# Patient Record
Sex: Female | Born: 1990
Health system: Southern US, Community
[De-identification: ages and names within clinical notes are randomized; demographics above are authoritative.]

## PROBLEM LIST (undated history)

## (undated) ENCOUNTER — Ambulatory Visit (HOSPITAL_COMMUNITY): Discharge status: Absent without leave | Payer: Self-pay

## (undated) DIAGNOSIS — J45909 Unspecified asthma, uncomplicated: Secondary | ICD-10-CM

## (undated) DIAGNOSIS — J309 Allergic rhinitis, unspecified: Secondary | ICD-10-CM

## (undated) HISTORY — DX: Allergic rhinitis, unspecified: J30.9

---
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
## 2015-08-14 ENCOUNTER — Encounter (HOSPITAL_COMMUNITY): Payer: Self-pay | Admitting: *Deleted

## 2015-08-14 ENCOUNTER — Emergency Department (HOSPITAL_COMMUNITY)
Admission: EM | Admit: 2015-08-14 | Discharge: 2015-08-14 | Disposition: A | Discharge status: Home / Self Care | Payer: Self-pay | Attending: Emergency Medicine | Admitting: Emergency Medicine

## 2015-08-14 DIAGNOSIS — Z3202 Encounter for pregnancy test, result negative: Secondary | ICD-10-CM | POA: Insufficient documentation

## 2015-08-14 DIAGNOSIS — N12 Tubulo-interstitial nephritis, not specified as acute or chronic: Secondary | ICD-10-CM | POA: Insufficient documentation

## 2015-08-14 LAB — URINALYSIS, ROUTINE W REFLEX MICROSCOPIC
Bilirubin Urine: NEGATIVE
Glucose, UA: NEGATIVE mg/dL
Ketones, ur: 15 mg/dL — AB
Nitrite: POSITIVE — AB
PH: 5.5 (ref 5.0–8.0)
Protein, ur: 30 mg/dL — AB
Specific Gravity, Urine: 1.018 (ref 1.005–1.030)

## 2015-08-14 LAB — BASIC METABOLIC PANEL
Anion gap: 14 (ref 5–15)
BUN: 10 mg/dL (ref 6–20)
CO2: 21 mmol/L — ABNORMAL LOW (ref 22–32)
Calcium: 10.1 mg/dL (ref 8.9–10.3)
Chloride: 102 mmol/L (ref 101–111)
Creatinine, Ser: 0.78 mg/dL (ref 0.44–1.00)
GFR calc Af Amer: >60 mL/min (ref >60)
GFR calc non Af Amer: >60 mL/min (ref >60)
Glucose, Bld: 98 mg/dL (ref 65–99)
Potassium: 4.1 mmol/L (ref 3.5–5.1)
Sodium: 137 mmol/L (ref 135–145)

## 2015-08-14 LAB — CBC
HCT: 44.6 % (ref 36.0–46.0)
Hemoglobin: 15.3 g/dL — ABNORMAL HIGH (ref 12.0–15.0)
MCH: 31.9 pg (ref 26.0–34.0)
MCHC: 34.3 g/dL (ref 30.0–36.0)
MCV: 92.9 fL (ref 78.0–100.0)
Platelets: 265 10*3/uL (ref 150–400)
RBC: 4.8 MIL/uL (ref 3.87–5.11)
RDW: 12.2 % (ref 11.5–15.5)
WBC: 11.6 10*3/uL — ABNORMAL HIGH (ref 4.0–10.5)

## 2015-08-14 LAB — URINE MICROSCOPIC-ADD ON

## 2015-08-14 LAB — I-STAT BETA HCG BLOOD, ED (MC, WL, AP ONLY)

## 2015-08-14 MED ORDER — IBUPROFEN 800 MG PO TABS
800.0000 mg | ORAL_TABLET | Freq: Once | ORAL | Status: AC
  Administered 2015-08-14: 800 mg via ORAL
  Filled 2015-08-14: qty 1

## 2015-08-14 MED ORDER — AMOXICILLIN-POT CLAVULANATE 875-125 MG PO TABS: 1.0000 | ORAL_TABLET | Freq: Two times a day (BID) | ORAL | Status: DC

## 2015-08-14 MED ORDER — AMOXICILLIN-POT CLAVULANATE 875-125 MG PO TABS
1.0000 | ORAL_TABLET | Freq: Once | ORAL | Status: AC
  Administered 2015-08-14: 1 via ORAL
  Filled 2015-08-14: qty 1

## 2015-08-14 MED ORDER — IBUPROFEN 800 MG PO TABS: 800.0000 mg | ORAL_TABLET | Freq: Three times a day (TID) | ORAL | Status: DC

## 2015-08-14 NOTE — Discharge Instructions (Signed)
Pyelonephritis, Adult °Pyelonephritis is a kidney infection. The kidneys are the organs that filter a person's blood and move waste out of the bloodstream and into the urine. Urine passes from the kidneys, through the ureters, and into the bladder. There are two main types of pyelonephritis: °· Infections that come on quickly without any warning (acute pyelonephritis). °· Infections that last for a long period of time (chronic pyelonephritis). °In most cases, the infection clears up with treatment and does not cause further problems. More severe infections or chronic infections can sometimes spread to the bloodstream or lead to other problems with the kidneys. °CAUSES °This condition is usually caused by: °· Bacteria traveling from the bladder to the kidney through infected urine. The urine in the bladder can become infected with bacteria from: °¨ Bladder infection (cystitis). °¨ Inflammation of the prostate gland (prostatitis). °¨ Sexual intercourse, in females. °· Bacteria traveling from the bloodstream to the kidney. °RISK FACTORS °This condition is more likely to develop in: °· Pregnant women. °· Older people. °· People who have diabetes. °· People who have kidney stones or bladder stones. °· People who have other abnormalities of the kidney or ureter. °· People who have a catheter placed in the bladder. °· People who have cancer. °· People who are sexually active. °· Women who use spermicides. °· People who have had a prior urinary tract infection. °SYMPTOMS °Symptoms of this condition include: °· Frequent urination. °· Strong or persistent urge to urinate. °· Burning or stinging when urinating. °· Abdominal pain. °· Back pain. °· Pain in the side or flank area. °· Fever. °· Chills. °· Blood in the urine, or dark urine. °· Nausea. °· Vomiting. °DIAGNOSIS °This condition may be diagnosed based on: °· Medical history and physical exam. °· Urine tests. °· Blood tests. °You may also have imaging tests of the  kidneys, such as an ultrasound or CT scan. °TREATMENT °Treatment for this condition may depend on the severity of the infection. °· If the infection is mild and is found early, you may be treated with antibiotic medicines taken by mouth. You will need to drink fluids to remain hydrated. °· If the infection is more severe, you may need to stay in the hospital and receive antibiotics given directly into a vein through an IV tube. You may also need to receive fluids through an IV tube if you are not able to remain hydrated. After your hospital stay, you may need to take oral antibiotics for a period of time. °Other treatments may be required, depending on the cause of the infection. °HOME CARE INSTRUCTIONS °Medicines °· Take over-the-counter and prescription medicines only as told by your health care provider. °· If you were prescribed an antibiotic medicine, take it as told by your health care provider. Do not stop taking the antibiotic even if you start to feel better. °General Instructions °· Drink enough fluid to keep your urine clear or pale yellow. °· Avoid caffeine, tea, and carbonated beverages. They tend to irritate the bladder. °· Urinate often. Avoid holding in urine for long periods of time. °· Urinate before and after sex. °· After a bowel movement, women should cleanse from front to back. Use each tissue only once. °· Keep all follow-up visits as told by your health care provider. This is important. °SEEK MEDICAL CARE IF: °· Your symptoms do not get better after 2 days of treatment. °· Your symptoms get worse. °· You have a fever. °SEEK IMMEDIATE MEDICAL CARE IF: °· You   are unable to take your antibiotics or fluids.  You have shaking chills.  You vomit.  You have severe flank or back pain.  You have extreme weakness or fainting.   This information is not intended to replace advice given to you by your health care provider. Make sure you discuss any questions you have with your health care  provider.   Document Released: 07/13/2005 Document Revised: 04/03/2015 Document Reviewed: 11/05/2014 Elsevier Interactive Patient Education 2016 [employer].  Emergency Department Resource Guide 1) Find a Doctor and Pay Out of Pocket Although you won't have to find out who is covered by your insurance plan, it is a good idea to ask around and get recommendations. You will then need to call the office and see if the doctor you have chosen will accept you as a new patient and what types of options they offer for patients who are self-pay. Some doctors offer discounts or will set up payment plans for their patients who do not have insurance, but you will need to ask so you aren't surprised when you get to your appointment.  2) Contact Your Local Health Department Not all health departments have doctors that can see patients for sick visits, but many do, so it is worth a call to see if yours does. If you don't know where your local health department is, you can check in your phone book. The CDC also has a tool to help you locate your state's health department, and many state websites also have listings of all of their local health departments.  3) Find a Walk-in Clinic If your illness is not likely to be very severe or complicated, you may want to try a walk in clinic. These are popping up all over the country in pharmacies, drugstores, and shopping centers. They're usually staffed by nurse practitioners or physician assistants that have been trained to treat common illnesses and complaints. They're usually fairly quick and inexpensive. However, if you have serious medical issues or chronic medical problems, these are probably not your best option.  No Primary Care Doctor: - Call Health Connect at  [phone] - they can help you locate a primary care doctor that  accepts your insurance, provides certain services, etc. - Physician Referral Service- [phone]  Chronic Pain  Problems: Organization         Address  Phone   Notes  [facility]  [phone] Patients need to be referred by their primary care doctor.   Medication Assistance: Organization         Address  Phone   Notes  [facility] Medication [facility] [address] [place] [place], [place] [address] [phone] --Must be a resident of [facility] -- Must have NO insurance coverage whatsoever (no Medicaid/ Medicare, etc.) -- The pt. MUST have a primary care doctor that directs their care regularly and follows them in the community   [facility]  [phone]   [employer]  [phone]    Agencies that provide inexpensive medical care: Organization         Address  Phone   Notes  [facility]  [phone]   [facility]    [phone]   [facility] [address] [place], [place] [address] [phone]   [facility] [address], [place] [phone]   [facility]    [phone]   [facility]    (  [phone]) [phone]   °[facility] ° [address], [place] Phone:  ([phone]) [phone], Fax:  [phone] Hours of Operation:  9 am - 6 pm, M-F.  Also accepts Medicaid/Medicare and self-pay.  °[facility] ° [address], [place] Phone: ([phone]) [phone], Fax: [phone]. Hours of Operation:  8:30 am - 5:30 pm, M-F.  Also accepts Medicaid and self-pay.  °[facility] [place] [address], [place] Phone: ([phone]) [phone]   °[facility] [address], [place], [place] (336)[phone], Ext. 123 Mondays & Thursdays: 7-9 AM.  First 15 patients are seen on a first come, first serve basis. °  ° °Medicaid-accepting [place] Providers: ° °Organization         Address  Phone   Notes  °[facility] [address], [place] ([phone]) [phone] Also  accepts self-pay patients.  °[facility] [address], [place] ° ([phone]) [phone]   °[facility] [address], [place] ([phone]) [phone]   °[facility] [address] [place] [address], [place] ([phone]) [phone]   °Veita Bland [address], [place]  ° ([phone]) [phone] Only accepts [place] Access Medicaid patients after they have their name applied to their card.  ° °Self-Pay (no insurance) in [place]: ° °Organization         Address  Phone   Notes  °Sickle Cell Patients, [facility] [address], [place] ([phone]) [phone]   °[facility] [address], [place] (336) [phone]   °[facility] ° [address], [place] ([phone]) [phone]   °[facility]/Dr. Osei-Bonsu ° [address] [place] [address], [place] or [address], [place] ([phone]) [phone] Phone number for both [place] and [place] locations is the same.  °[facility] [address], [place] ([phone]) [phone]   °[facility] [address] [place] [address], [place] or [address] (336) 852-7530 °(336) [phone]   °[facility] [address],  (336) 350-1642, phone; ([phone]) [phone], fax Sees patients 1st and 3rd Saturday of every month.  Must not qualify for public or private insurance (i.e. Medicaid, Medicare, [place] Health Choice, Veterans' Benefits) • Household income should be no more than 200% of the poverty level •The clinic cannot treat you if you are pregnant or think you are pregnant • Sexually transmitted diseases are not treated at the clinic.  ° ° °Dental Care: °Organization         Address  Phone  Notes  °[place] [facility] [address],  ([phone]) [phone] Accepts children up to age 21 who are enrolled in Medicaid or [place] Health Choice; pregnant  women with a Medicaid card; and children who have applied for Medicaid or [place] Health Choice, but were declined, whose parents can pay a reduced fee at time of service.  °[place] [facility] [place]  [address], [place] (336) [phone] Accepts children up to age 21 who are enrolled in Medicaid or [place] Health Choice; pregnant women with a Medicaid card; and children who have applied for Medicaid or  Health Choice, but were declined, whose parents can pay a reduced fee at time of service.  °[facility] ° [address], [place] (336) [phone] Patients are seen by appointment only. Walk-ins are not accepted. [facility] will see patients 18 years of age and older. °Monday - Tuesday (8am-5pm) °Most Wednesdays (8:30-5pm) °$30 per visit, cash only  °[facility] ° [address], [place] ([phone]) [phone] Patients are seen by appointment only. Walk-ins are not accepted. [facility] will see patients 18 years of age and older. °One Wednesday Evening (Monthly: Volunteer Based).  $30 per visit, cash only  °[facility]  ([phone]) [phone] for adults; Children under age 4, call Graduate Pediatric Dentistry at [phone]. Children aged 4-14, please call [phone]) [phone] to request a pediatric application. ° Dental services are provided in all areas of dental care including fillings, crowns and bridges, complete and partial dentures, implants, gum treatment, root canals, and extractions. Preventive care is also provided. Treatment is provided to both adults and children. °Patients are selected via a lottery and there is often a waiting list. °  °[facility] [address], °[place] ° (336) 763-8833 [email or web address] °  °[facility] [address], [place], [place] (336)723-1848, Ext. 123 Second and Fourth Thursday of each month, opens at 6:30 AM; Clinic ends at 9 AM.  Patients are  seen on a first-come first-served basis, and a limited number are seen during each clinic.  ° °[facility] ° [address], [place], [place] ([phone]) [phone]   Eligibility Requirements °You must have lived in [place], [place], or [place] counties for at least the last three months. °  You cannot be eligible for state or federal sponsored healthcare insurance, including Veterans Administration, Medicaid, or Medicare. °  You generally cannot be eligible for healthcare insurance through your employer.  °  How to apply: °Eligibility screenings are held every Tuesday and Wednesday afternoon from 1:00 pm until 4:00 pm. You do not need an appointment for the interview!  °[facility] [address], [place], [place] [phone]   °[facility]  336-342-8273   °[place]  336-703-3100   °[facility] [place] [facility]  336-570-6415   ° °Behavioral Health Resources in the Community: °Intensive Outpatient Programs °Organization         Address  Phone  Notes  °[facility] [address], [place], [place] 336-878-6098   °[facility] [address], [place], [place] 336-832-9800   °[facility] [address], [place], [place] ° 336-882-2125   °[facility] [address],  °[place], [place] 1-800-853-5163 or 336-641-4981   °Substance Abuse Resources °Organization         Address  Phone  Notes  °[facility]  336-882-2125   °[facility]  336-784-9470   °[facility]  336-285-9073   °[facility]  336-845-3988   °Residential & Outpatient Substance Abuse Program  1-800-659-3381   °Psychological Services °Organization         Address  Phone  Notes  ° Health  336- 832-9600   °[facility]  336- 378-7881   °[facility] [address], [place] 1-800-853-5163 or 336-641-4981   ° °Mobile Crisis  Teams °Organization         Address  Phone  Notes  °[facility], Mobile Crisis Care Unit  1-877-626-1772   °Assertive °[facility] ° [address] ,   [place] 161-096-0454   [facility] [address] [place] [phone]    Self-Help/Support Groups Organization         Address  Phone             Notes  [facility] - variety of support groups  336- I7437963 Call for more information  Narcotics Anonymous (NA), [facility] [address], [employer] [place]  2 meetings at this location   Statistician         Address  Phone  Notes  [facility] [address],    [place]  2-956-213-0865   [facility]  [address], [place] [address], [place], [place] 295-284-1324   [facility] [address] [place], [place] [phone] Admissions: 8am-3pm M-F  [facility] [address],    [place], [place] [phone]   [facility] [address] [place], [place], [place] [phone]   [facility] [address]  [place], [place] [phone]   [facility] Intensive Outpatient [address], [place], [place] [phone]   [facility] ([facility] [address] [place].,  [place], [place] 9-323-557-3220 or 517-735-0256   [facility] [address] [place], [place] 628-315-1761 Accepts Medicaid  [facility] [address],  [place] 6-073-710-6269 Substance Abuse/Addiction Treatment   [facility] Organization         Address  Phone  Notes  [facility]  [phone]   Angie Fava, PhD [address] [place], [place]   602-772-4204 or 828-196-0924   [facility]   [address] [place], [place] [phone]   [facility] [address] [place], [place] [phone])[phone]  Insurance/Medicaid/sponsorship through [facility] [address]                                    [place], [place] [phone] Therapy/tele-psych/case  [facility] [address], [place] [phone]    Dr. Lolly Mustache  [phone]   [facility]  [employer] [facility] Dept. 1) [address] [place] 2) [address] [place] 3)  [address], [place] [phone]  [phone]   [facility] Child Abuse Hotline 4047974068 or 949-582-9675 (After Hours)

## 2015-08-14 NOTE — ED Provider Notes (Signed)
CSN: [id number]     Arrival date & time 08/14/15  1833 History   First MD Initiated Contact with Patient 08/14/15 2111     Chief Complaint  Patient presents with  . Urinary Tract Infection     (Consider location/radiation/quality/duration/timing/severity/associated sxs/prior Treatment) HPI Patient developed flank pain 5 days ago. She reports it's on the right. She describes it as gradual in onset. It's a general ache. This morning she developed a fever and was seen at urgent care. She had a positive urine dip and was referred to the emergency department for further evaluation. Patient reports that she has had no vomiting or diarrhea. No nausea. No vaginal bleeding or discharge. She reports that the pain is actually somewhat improved by standing and walking. It is not exacerbated by movements. History reviewed. No pertinent past medical history. History reviewed. No pertinent past surgical history. No family history on file. Social History  Substance Use Topics  . Smoking status: Never Smoker   . Smokeless tobacco: None  . Alcohol Use: No   OB History    No data available     Review of Systems  10 Systems reviewed and are negative for acute change except as noted in the HPI. Marland Kitchen  Allergies  Review of patient's allergies indicates no known allergies.  Home Medications   Prior to Admission medications   Medication Sig Start Date End Date Taking? Authorizing Provider  aspirin-acetaminophen-caffeine (EXCEDRIN MIGRAINE) [phone] MG tablet Take 2 tablets by mouth every 6 (six) hours as needed for headache.   Yes Historical Provider, MD  amoxicillin-clavulanate (AUGMENTIN) 875-125 MG tablet Take 1 tablet by mouth 2 (two) times daily. One po bid x 7 days 08/14/15   Arby Barrette, MD  ibuprofen (ADVIL,MOTRIN) 800 MG tablet Take 1 tablet (800 mg total) by mouth 3 (three) times daily. 08/14/15   Arby Barrette, MD   BP 125/74 mmHg  Pulse 112  Temp(Src) 100.5 F (38.1 C) (Oral)  Resp  16  SpO2 98% Physical Exam  Constitutional: She is oriented to person, place, and time. She appears well-developed and well-nourished.  HENT:  Head: Normocephalic and atraumatic.  Eyes: EOM are normal. Pupils are equal, round, and reactive to light.  Neck: Neck supple.  Cardiovascular: Normal rate, regular rhythm, normal heart sounds and intact distal pulses.   Pulmonary/Chest: Effort normal and breath sounds normal.  Abdominal: Soft. Bowel sounds are normal. She exhibits no distension. There is no tenderness.  No flank pain to percussion. Patient can sit up and stand without exacerbating pain.  Musculoskeletal: Normal range of motion. She exhibits no edema.  Neurological: She is alert and oriented to person, place, and time. She has normal strength. Coordination normal. GCS eye subscore is 4. GCS verbal subscore is 5. GCS motor subscore is 6.  Skin: Skin is warm, dry and intact.  Psychiatric: She has a normal mood and affect.    ED Course  Procedures (including critical care time) Labs Review Labs Reviewed  CBC - Abnormal; Notable for the following:    WBC 11.6 (*)    Hemoglobin 15.3 (*)    All other components within normal limits  BASIC METABOLIC PANEL - Abnormal; Notable for the following:    CO2 21 (*)    All other components within normal limits  URINALYSIS, ROUTINE W REFLEX MICROSCOPIC (NOT AT [facility]) - Abnormal; Notable for the following:    APPearance CLOUDY (*)    Hgb urine dipstick LARGE (*)    Ketones, ur 15 (*)  Protein, ur 30 (*)    Nitrite POSITIVE (*)    Leukocytes, UA SMALL (*)    All other components within normal limits  URINE MICROSCOPIC-ADD ON - Abnormal; Notable for the following:    Squamous Epithelial / LPF 6-30 (*)    Bacteria, UA MANY (*)    All other components within normal limits  URINE CULTURE  I-STAT BETA HCG BLOOD, ED ([facility], [facility], [facility] ONLY)    Imaging Review No results found. I have personally reviewed and evaluated these images and lab  results as part of my medical decision-making.   EKG Interpretation None      MDM   Final diagnoses:  Pyelonephritis   Patient developed gradual onset of flank pain. She is subsequently developed fever. Urinalysis is positive. At this time the patient is well in appearance. She is otherwise healthy. She has no vomiting and no reproducible intra-abdominal pain. She does have fever and mild tachycardia but no signs of volume depletion and no hypotension. At this time I feel she is safe to initiate outpatient antibiotic therapy for pyelonephritis. She is counseled on signs and symptoms for which to return.  Arby Barrette, MD 08/14/15 2328

## 2015-08-14 NOTE — ED Notes (Addendum)
Pt sent from [facility] clinic for evaluation for kidney infection. Pt had urinalysis performed at clinic, which showed infection. Pt complains of 4/10 right flank pain since Friday and had fever since this morning.

## 2015-08-17 LAB — URINE CULTURE

## 2015-08-18 ENCOUNTER — Telehealth (HOSPITAL_BASED_OUTPATIENT_CLINIC_OR_DEPARTMENT_OTHER): Payer: Self-pay | Admitting: Emergency Medicine

## 2015-08-18 NOTE — Telephone Encounter (Signed)
Post ED Visit - Positive Culture Follow-up  Culture report reviewed by antimicrobial stewardship pharmacist:   Enzo Bi, Pharm.D.  Celedonio Miyamoto, Pharm.D., BCPS  Garvin Fila, Pharm.D.  Georgina Pillion, Pharm.D., BCPS  [place], [address].D., BCPS, AAHIVP  Estella Husk, Pharm.D., BCPS, AAHIVP  Tennis Must, Pharm.D.  Sherle Poe, [address].D.  Positive urine culture E. coli Treated with augmentin, organism sensitive to the same and no further patient follow-up is required at this time.  Berle Mull 08/18/2015, 11:26 AM

## 2017-05-09 ENCOUNTER — Ambulatory Visit (HOSPITAL_COMMUNITY)
Admission: EM | Admit: 2017-05-09 | Discharge: 2017-05-09 | Disposition: A | Discharge status: Home / Self Care | Payer: Self-pay | Attending: Internal Medicine | Admitting: Internal Medicine

## 2017-05-09 ENCOUNTER — Encounter (HOSPITAL_COMMUNITY): Payer: Self-pay | Admitting: Emergency Medicine

## 2017-05-09 DIAGNOSIS — B350 Tinea barbae and tinea capitis: Secondary | ICD-10-CM

## 2017-05-09 DIAGNOSIS — J4521 Mild intermittent asthma with (acute) exacerbation: Secondary | ICD-10-CM

## 2017-05-09 HISTORY — DX: Unspecified asthma, uncomplicated: J45.909

## 2017-05-09 MED ORDER — IPRATROPIUM-ALBUTEROL 0.5-2.5 (3) MG/3ML IN SOLN
3.0000 mL | Freq: Once | RESPIRATORY_TRACT | Status: AC
  Administered 2017-05-09: 3 mL via RESPIRATORY_TRACT

## 2017-05-09 MED ORDER — FLUCONAZOLE 200 MG PO TABS: 200.0000 mg | ORAL_TABLET | ORAL | 0 refills | Status: AC

## 2017-05-09 MED ORDER — ALBUTEROL SULFATE HFA 108 (90 BASE) MCG/ACT IN AERS: 1.0000 | INHALATION_SPRAY | RESPIRATORY_TRACT | 0 refills | Status: DC | PRN

## 2017-05-09 MED ORDER — IPRATROPIUM-ALBUTEROL 0.5-2.5 (3) MG/3ML IN SOLN
RESPIRATORY_TRACT | Status: AC
  Filled 2017-05-09: qty 3

## 2017-05-09 MED ORDER — PREDNISONE 50 MG PO TABS
50.0000 mg | ORAL_TABLET | Freq: Every day | ORAL | 0 refills | Status: AC
Start: 2017-05-09 — End: 2017-05-14

## 2017-05-09 NOTE — ED Triage Notes (Signed)
Pt sts increased asthma sx over last week with cough

## 2017-05-09 NOTE — ED Provider Notes (Signed)
[facility]    CSN: [id number] Arrival date & time: 05/09/17  1237     History   Chief Complaint Chief Complaint  Patient presents with  . Asthma    HPI Linda Carrillo is a 26 y.o. female. She presents today with one-month history of increasing cough, breathlessness. She has a history of asthma managed with as needed albuterol. In the last week, she is waking frequently at night with breathlessness. Using her inhaler much more than usual.  Not feeling sick, no runny/congested nose, no sore throat, not achy. Also in the last couple days has an itchy/dry rash at her hairline, particularly the left temple. Her sister has a similar rash.  No family hx psoriasis   HPI  Past Medical History:  Diagnosis Date  . Asthma     History reviewed. No pertinent surgical history.    Home Medications    Prior to Admission medications   Medication Sig Start Date End Date Taking? Authorizing Provider  albuterol (PROVENTIL HFA;VENTOLIN HFA) 108 (90 Base) MCG/ACT inhaler Inhale 1-2 puffs into the lungs every 4 (four) hours as needed for wheezing or shortness of breath. 05/09/17   Eustace Moore, MD  amoxicillin-clavulanate (AUGMENTIN) 875-125 MG tablet Take 1 tablet by mouth 2 (two) times daily. One po bid x 7 days 08/14/15   Arby Barrette, MD  aspirin-acetaminophen-caffeine [facility] MIGRAINE) [phone] MG tablet Take 2 tablets by mouth every 6 (six) hours as needed for headache.    [provider]  fluconazole (DIFLUCAN) 200 MG tablet Take 1 tablet (200 mg total) by mouth once a week. 05/09/17 06/14/17  Eustace Moore, MD  ibuprofen (ADVIL,MOTRIN) 800 MG tablet Take 1 tablet (800 mg total) by mouth 3 (three) times daily. 08/14/15   Arby Barrette, MD  predniSONE (DELTASONE) 50 MG tablet Take 1 tablet (50 mg total) by mouth daily. 05/09/17 05/14/17  Eustace Moore, MD    Family History History reviewed. No pertinent family history.  Social History Social History    Substance Use Topics  . Smoking status: Never Smoker  . Smokeless tobacco: Never Used  . Alcohol use No     Allergies   Patient has no known allergies.   Review of Systems Review of Systems  All other systems reviewed and are negative.    Physical Exam Triage Vital Signs ED Triage Vitals [05/09/17 1256]  Enc Vitals Group     BP 132/86     Pulse Rate (!) 102     Resp 18     Temp 98.2 F (36.8 C)     Temp Source Oral     SpO2 97 %     Weight      Height      Pain Score      Pain Loc    Updated Vital Signs BP 132/86 (BP Location: Right Arm)   Pulse (!) 102   Temp 98.2 F (36.8 C) (Oral)   Resp 18   SpO2 97%   Physical Exam  Constitutional: She is oriented to person, place, and time. No distress.  HENT:  Head: Atraumatic.  B TMs translucent, no erythema Minimal nasal congestion bilat Throat pink, scant clear post nasal drainage evident  Eyes:  Conjugate gaze observed, no eye redness/discharge  Neck: Neck supple.  Cardiovascular: Normal rate and regular rhythm.   Pulmonary/Chest: No respiratory distress. She has no rales.  Coarse but symmetric breath sounds throughout, diminished posteriorly with decreased air movement and scattered wheezes  Abdominal:  She exhibits no distension.  Musculoskeletal: Normal range of motion.  Neurological: She is alert and oriented to person, place, and time.  Skin: Skin is warm and dry.  No cyanosis  Nursing note and vitals reviewed.    UC Treatments / Results   Procedures Procedures (including critical care time)  Medications Ordered in UC Medications  ipratropium-albuterol (DUONEB) 0.5-2.5 (3) MG/3ML nebulizer solution 3 mL (3 mLs Nebulization Given 05/09/17 1325)      Final Clinical Impressions(s) / UC Diagnoses   Final diagnoses:  Mild intermittent asthma with exacerbation  Tinea capitis   Anticipate gradual improvement in cough and breathlessness. DuoNeb breathing treatment given at the urgent care  today. Prescriptions for an albuterol inhaler and prednisone were sent to the pharmacy. Rash is consistent with ringworm (tinea).  Avoid sharing hair care items.  Prescription for fluconazole was sent to the pharmacy.  Please establish care with a primary care provider to guide further treatment plans for both these issues.   New Prescriptions New Prescriptions   ALBUTEROL (PROVENTIL HFA;VENTOLIN HFA) 108 (90 BASE) MCG/ACT INHALER    Inhale 1-2 puffs into the lungs every 4 (four) hours as needed for wheezing or shortness of breath.   FLUCONAZOLE (DIFLUCAN) 200 MG TABLET    Take 1 tablet (200 mg total) by mouth once a week.   PREDNISONE (DELTASONE) 50 MG TABLET    Take 1 tablet (50 mg total) by mouth daily.     Controlled Substance Prescriptions [place] Controlled Substance Registry consulted? No   Eustace Moore, MD 05/11/17 1240

## 2017-05-09 NOTE — ED Notes (Signed)
Pt also sts ring worm type rash to forehead

## 2017-05-09 NOTE — Discharge Instructions (Addendum)
Anticipate gradual improvement in cough and breathlessness. DuoNeb breathing treatment given at the urgent care today. Prescriptions for an albuterol inhaler and prednisone were sent to the pharmacy. Rash is consistent with ringworm (tinea).  Avoid sharing hair care items.  Prescription for fluconazole was sent to the pharmacy.  Please establish care with a primary care provider to guide further treatment plans for both these issues.  ```````````````````````

## 2017-11-07 ENCOUNTER — Ambulatory Visit (HOSPITAL_COMMUNITY)
Admission: EM | Admit: 2017-11-07 | Discharge: 2017-11-07 | Disposition: A | Discharge status: Home / Self Care | Payer: Self-pay | Attending: Internal Medicine | Admitting: Internal Medicine

## 2017-11-07 ENCOUNTER — Other Ambulatory Visit: Payer: Self-pay

## 2017-11-07 DIAGNOSIS — R070 Pain in throat: Secondary | ICD-10-CM | POA: Insufficient documentation

## 2017-11-07 DIAGNOSIS — J45909 Unspecified asthma, uncomplicated: Secondary | ICD-10-CM | POA: Insufficient documentation

## 2017-11-07 DIAGNOSIS — Q388 Other congenital malformations of pharynx: Secondary | ICD-10-CM | POA: Insufficient documentation

## 2017-11-07 DIAGNOSIS — R6889 Other general symptoms and signs: Secondary | ICD-10-CM

## 2017-11-07 LAB — POCT RAPID STREP A: Streptococcus, Group A Screen (Direct): NEGATIVE

## 2017-11-07 NOTE — ED Triage Notes (Signed)
Per pt she have this sensation like she have something stuck in her throat and can't get it out, per pt she does not have SOB and denies all other sx

## 2017-11-07 NOTE — Discharge Instructions (Signed)
You need specialist evaluation regarding the skin flap in your throat.  I referred you to Dr. Jen[facility]Bates [facility].  However you can request an appointment with any of those providers as that is the practice who is on-call today.  I would call tomorrow to make an appointment to be seen within the next month.

## 2017-11-07 NOTE — ED Provider Notes (Signed)
11/07/2017 1:20 PM   DOB: 1990-07-31 / MRN: [id number]  SUBJECTIVE:  Linda Carrillo is a 27 y.o. female presenting for abnormal throat anatomy.  Noticed this yesterday while looking at her throat due to mild throat pain.  She tells me she saw a "skin flap."  She denies pain.   She denies a history of smoking, and chewing tobacco.  She has No Known Allergies.   She  has a past medical history of Asthma.    She  reports that she has never smoked. She has never used smokeless tobacco. She reports that she does not drink alcohol. She  has no sexual activity history on file. The patient  has no past surgical history on file.  Her family history is not on file.  Review of Systems  Constitutional: Negative for fever.  HENT: Negative for sinus pain and sore throat.   Respiratory: Negative for cough.   Cardiovascular: Negative for chest pain.  Skin: Negative for rash.    OBJECTIVE:  BP 108/82 (BP Location: Left Arm)   Pulse (!) 103 Comment: reported HR to Nurse Kim Lapan-Hutchens  Temp 98.9 F (37.2 C) (Oral)   Resp 16   SpO2 97%   Physical Exam  Constitutional: She is oriented to person, place, and time. She appears well-developed.  HENT:  Mouth/Throat:    Eyes: Pupils are equal, round, and reactive to light. EOM are normal.  Cardiovascular: Normal rate.  Pulmonary/Chest: Effort normal.  Abdominal: She exhibits no distension.  Musculoskeletal: Normal range of motion.  Neurological: She is alert and oriented to person, place, and time. No cranial nerve deficit.  Skin: Skin is warm and dry. She is not diaphoretic.  Psychiatric: She has a normal mood and affect.  Vitals reviewed.   Results for orders placed or performed during the hospital encounter of 11/07/17 (from the past 72 hour(s))  POCT rapid strep A [facility]     Status: None   Collection Time: 11/07/17  1:02 PM  Result Value Ref Range   Streptococcus, Group A Screen (Direct) NEGATIVE NEGATIVE    No results  found.  ASSESSMENT AND PLAN:  Orders Placed This Encounter  Procedures  . Culture, group A strep    Standing Status:   Standing    Number of Occurrences:   1  . POCT rapid strep A [facility])    Standing Status:   Standing    Number of Occurrences:   1     Abnormal ear, nose, and throat evaluation: Given her abnormal exam I think she may require biopsy to rule out HPV.  I referred her to GI so otolaryngology for further evaluation.      The patient is advised to call or return to clinic if she does not see an improvement in symptoms, or to seek the care of the closest emergency department if she worsens with the above plan.   Deliah BostonMichael Clark, MHS, PA-C 11/07/2017 1:20 PM   Ofilia Neaslark, Michael L, PA-C 11/07/17 1322

## 2017-11-10 LAB — CULTURE, GROUP A STREP (THRC)

## 2019-05-22 ENCOUNTER — Other Ambulatory Visit: Payer: Self-pay

## 2019-05-22 ENCOUNTER — Other Ambulatory Visit: Payer: Self-pay | Admitting: *Deleted

## 2019-05-22 DIAGNOSIS — Z124 Encounter for screening for malignant neoplasm of cervix: Secondary | ICD-10-CM | POA: Insufficient documentation

## 2019-05-22 NOTE — Progress Notes (Signed)
Patient: Providencia Hottenstein           Date of Birth: [birth year]           MRN: [id number] Visit Date: 05/22/2019 PCP: Patient, No Pcp Per  Temp: 97.1 Temporal  Cervical Cancer Screening Do you smoke?: No Have you ever had or been told you have an allergy to latex products?: No Marital status: Single Date of last pap smear: Never Date of last menstrual period: 03/29/19 Number of pregnancies: 0 Number of births: 0 Have you ever had any of the following? Hysterectomy: No Tubal ligation (tubes tied): No Abnormal bleeding: Yes Abnormal pap smear: No Venereal warts: No A sex partner with venereal warts: No A high risk* sex partner: No  Cervical Exam Normal Exam. If today's Pap smear is normal next Pap smear is due in three years.  Patient's History Patient Active Problem List   Diagnosis Date Noted  . Pap smear for cervical cancer screening 05/22/2019   Past Medical History:  Diagnosis Date  . Asthma     No family history on file.  Social History   Occupational History  . Not on file  Tobacco Use  . Smoking status: Never Smoker  . Smokeless tobacco: Never Used  Substance and Sexual Activity  . Alcohol use: No  . Drug use: Not on file  . Sexual activity: Not on file

## 2019-05-24 LAB — CYTOLOGY - PAP: Diagnosis: NEGATIVE

## 2019-06-16 DIAGNOSIS — Z3042 Encounter for surveillance of injectable contraceptive: Secondary | ICD-10-CM | POA: Diagnosis not present

## 2019-08-28 DIAGNOSIS — Z3042 Encounter for surveillance of injectable contraceptive: Secondary | ICD-10-CM | POA: Diagnosis not present

## 2019-10-13 ENCOUNTER — Ambulatory Visit: Payer: BC Managed Care – PPO | Attending: Internal Medicine

## 2019-10-13 DIAGNOSIS — Z23 Encounter for immunization: Secondary | ICD-10-CM

## 2019-10-13 NOTE — Progress Notes (Signed)
   Covid-19 Vaccination Clinic  Name:  Linda Carrillo    MRN: [id number] DOB: [birth year]  10/13/2019  [name] was observed post Covid-19 immunization for 15 minutes without incident. She was provided with Vaccine Information Sheet and instruction to access the V-Safe system.   [name] was instructed to call 911 with any severe reactions post vaccine: Marland Kitchen Difficulty breathing  . Swelling of face and throat  . A fast heartbeat  . A bad rash all over body  . Dizziness and weakness   Immunizations Administered    Name Date Dose VIS Date Route   Pfizer COVID-19 Vaccine 10/13/2019  8:58 AM 0.3 mL 07/07/2019 Intramuscular   Manufacturer: [employer], [employer]   Lot: [id number]   NDC: [address]-1

## 2019-10-19 ENCOUNTER — Encounter (INDEPENDENT_AMBULATORY_CARE_PROVIDER_SITE_OTHER): Payer: Self-pay | Admitting: Primary Care

## 2019-10-19 ENCOUNTER — Other Ambulatory Visit: Payer: Self-pay

## 2019-10-19 ENCOUNTER — Ambulatory Visit (INDEPENDENT_AMBULATORY_CARE_PROVIDER_SITE_OTHER): Payer: BC Managed Care – PPO | Admitting: Primary Care

## 2019-10-19 VITALS — BP 125/78 | HR 84 | Temp 97.2°F | Ht 64.0 in | Wt 189.0 lb

## 2019-10-19 DIAGNOSIS — Z789 Other specified health status: Secondary | ICD-10-CM | POA: Diagnosis not present

## 2019-10-19 DIAGNOSIS — J4520 Mild intermittent asthma, uncomplicated: Secondary | ICD-10-CM

## 2019-10-19 DIAGNOSIS — F418 Other specified anxiety disorders: Secondary | ICD-10-CM | POA: Diagnosis not present

## 2019-10-19 DIAGNOSIS — Z7689 Persons encountering health services in other specified circumstances: Secondary | ICD-10-CM | POA: Diagnosis not present

## 2019-10-19 MED ORDER — ALBUTEROL SULFATE HFA 108 (90 BASE) MCG/ACT IN AERS: 1.0000 | INHALATION_SPRAY | RESPIRATORY_TRACT | 1 refills | Status: DC | PRN

## 2019-10-19 MED ORDER — MONTELUKAST SODIUM 10 MG PO TABS: 10.0000 mg | ORAL_TABLET | Freq: Every day | ORAL | 3 refills | Status: DC

## 2019-10-19 MED ORDER — BUDESONIDE-FORMOTEROL FUMARATE 160-4.5 MCG/ACT IN AERO: 2.0000 | INHALATION_SPRAY | Freq: Two times a day (BID) | RESPIRATORY_TRACT | 3 refills | Status: DC

## 2019-10-19 NOTE — Patient Instructions (Signed)

## 2019-10-19 NOTE — Progress Notes (Signed)
Established Patient Office Visit  Subjective:  Patient ID: Linda Carrillo, female    DOB: [birth year]  Age: 29 y.o. MRN: [id number]  CC:  Chief Complaint  Patient presents with  . New Patient (Initial Visit)    depression/anxiety/asthma     HPI [name] is a 29 year old Hispanic female in today to establish care and is concern with uncontrolled asthma. She also , expressed anxiety and depression related to how the world is now , pandemic , social isolation and fear of going out. Past Medical History:  Diagnosis Date  . Asthma     History reviewed. No pertinent surgical history.  History reviewed. No pertinent family history.  Social History   Socioeconomic History  . Marital status: Single    Spouse name: Not on file  . Number of children: Not on file  . Years of education: Not on file  . Highest education level: Not on file  Occupational History  . Not on file  Tobacco Use  . Smoking status: Never Smoker  . Smokeless tobacco: Never Used  Substance and Sexual Activity  . Alcohol use: No  . Drug use: Not on file  . Sexual activity: Not on file  Other Topics Concern  . Not on file  Social History Narrative  . Not on file   Social Determinants of Health   Financial Resource Strain:   . Difficulty of Paying Living Expenses:   Food Insecurity:   . Worried About Programme researcher, broadcasting/film/video in the Last Year:   . Barista in the Last Year:   Transportation Needs:   . Freight forwarder (Medical):   Marland Kitchen Lack of Transportation (Non-Medical):   Physical Activity:   . Days of Exercise per Week:   . Minutes of Exercise per Session:   Stress:   . Feeling of Stress :   Social Connections:   . Frequency of Communication with Friends and Family:   . Frequency of Social Gatherings with Friends and Family:   . Attends Religious Services:   . Active Member of Clubs or Organizations:   . Attends Banker Meetings:   Marland Kitchen Marital Status:   Intimate  Partner Violence:   . Fear of Current or Ex-Partner:   . Emotionally Abused:   Marland Kitchen Physically Abused:   . Sexually Abused:     Outpatient Medications Prior to Visit  Medication Sig Dispense Refill  . albuterol (PROVENTIL HFA;VENTOLIN HFA) 108 (90 Base) MCG/ACT inhaler Inhale 1-2 puffs into the lungs every 4 (four) hours as needed for wheezing or shortness of breath. 1 Inhaler 0  . amoxicillin-clavulanate (AUGMENTIN) 875-125 MG tablet Take 1 tablet by mouth 2 (two) times daily. One po bid x 7 days 14 tablet 0  . aspirin-acetaminophen-caffeine (EXCEDRIN MIGRAINE) 250-250-65 MG tablet Take 2 tablets by mouth every 6 (six) hours as needed for headache.    . ibuprofen (ADVIL,MOTRIN) 800 MG tablet Take 1 tablet (800 mg total) by mouth 3 (three) times daily. 21 tablet 0   No facility-administered medications prior to visit.    No Known Allergies  ROS Review of Systems  All other systems reviewed and are negative.     Objective:    Physical Exam  Constitutional: She is oriented to person, place, and time. She appears well-developed and well-nourished.  HENT:  Head: Normocephalic.  Eyes: Pupils are equal, round, and reactive to light. EOM are normal.  Cardiovascular: Normal rate and regular rhythm.  Pulmonary/Chest:  Effort normal.  Abdominal: Soft. Bowel sounds are normal.  Musculoskeletal:        General: Normal range of motion.     Cervical back: Normal range of motion and neck supple.  Neurological: She is alert and oriented to person, place, and time. She has normal reflexes.  Skin: Skin is warm and dry.  Psychiatric: She has a normal mood and affect. Her behavior is normal. Judgment and thought content normal.    BP 125/78 (BP Location: Right Arm, Patient Position: Sitting, Cuff Size: Normal)   Pulse 84   Temp (!) 97.2 F (36.2 C) (Temporal)   Ht 5\' 4"  (1.626 m)   Wt 189 lb (85.7 kg)   SpO2 92%   BMI 32.44 kg/m  Wt Readings from Last 3 Encounters:  10/19/19 189 lb (85.7  kg)     Health Maintenance Due  Topic Date Due  . HIV Screening  Never done  . TETANUS/TDAP  Never done    There are no preventive care reminders to display for this patient.  No results found for: TSH Lab Results  Component Value Date   WBC 11.6 (H) 08/14/2015   HGB 15.3 (H) 08/14/2015   HCT 44.6 08/14/2015   MCV 92.9 08/14/2015   PLT 265 08/14/2015   Lab Results  Component Value Date   NA 137 08/14/2015   K 4.1 08/14/2015   CO2 21 (L) 08/14/2015   GLUCOSE 98 08/14/2015   BUN 10 08/14/2015   CREATININE 0.78 08/14/2015   CALCIUM 10.1 08/14/2015   ANIONGAP 14 08/14/2015   No results found for: CHOL No results found for: HDL No results found for: LDLCALC No results found for: TRIG No results found for: CHOLHDL No results found for: HGBA1C    Assessment & Plan:  Linda Carrillo was seen today for new patient (initial visit).  Diagnoses and all orders for this visit:  Encounter to establish care Juluis Mire, NP-C will be your  (PCP) she is mastered prepared . She is skilled to diagnosed and treat illness. Also able to answer health concern as well as continuing care of varied medical conditions, not limited by cause, organ system, or diagnosis.  -     Comprehensive metabolic panel  Mild intermittent asthma, unspecified whether complicated -     albuterol (VENTOLIN HFA) 108 (90 Base) MCG/ACT inhaler; Inhale 1-2 puffs into the lungs every 4 (four) hours as needed for wheezing or shortness of breath. -     montelukast (SINGULAIR) 10 MG tablet; Take 1 tablet (10 mg total) by mouth at bedtime. -     budesonide-formoterol (SYMBICORT) 160-4.5 MCG/ACT inhaler; Inhale 2 puffs into the lungs 2 (two) times daily.  Depression with anxiety Increase in anxiety and depression related to how the world is now , pandemic , social isolation and fear of going out. Schedule follow up with CSW. She does not like taking medication   Uses depot medroxyprogesterone acetate as primary birth  control method She does not have a menstrual cycle  -     CBC with Differential  Class 2 severe obesity due to excess calories with serious comorbidity in adult, unspecified BMI ([place]) Obesity is 30-39 indicating an excess in caloric intake or underlining conditions. This may lead to other co-morbidities-hypertension, diabetes and cardiovascular.  Lifestyle modifications of diet and exercise may reduce obesity.  -     Lipid Panel -     TSH + free T4    Meds ordered this encounter  Medications  .  albuterol (VENTOLIN HFA) 108 (90 Base) MCG/ACT inhaler    Sig: Inhale 1-2 puffs into the lungs every 4 (four) hours as needed for wheezing or shortness of breath.    Dispense:  8 g    Refill:  1  . montelukast (SINGULAIR) 10 MG tablet    Sig: Take 1 tablet (10 mg total) by mouth at bedtime.    Dispense:  90 tablet    Refill:  3  . budesonide-formoterol (SYMBICORT) 160-4.5 MCG/ACT inhaler    Sig: Inhale 2 puffs into the lungs 2 (two) times daily.    Dispense:  1 Inhaler    Refill:  3    Follow-up: Return in about 2 weeks (around 11/02/2019) for Depression/anxiety - CSW.    Grayce Sessions, NP

## 2019-10-20 LAB — TSH+FREE T4
Free T4: 1.38 ng/dL (ref 0.82–1.77)
TSH: 1.41 u[IU]/mL (ref 0.450–4.500)

## 2019-10-20 LAB — COMPREHENSIVE METABOLIC PANEL
ALT: 17 IU/L (ref 0–32)
AST: 16 IU/L (ref 0–40)
Albumin/Globulin Ratio: 1.4 (ref 1.2–2.2)
Albumin: 4.5 g/dL (ref 3.9–5.0)
Alkaline Phosphatase: 84 IU/L (ref 39–117)
BUN/Creatinine Ratio: 14 (ref 9–23)
BUN: 11 mg/dL (ref 6–20)
Bilirubin Total: 0.4 mg/dL (ref 0.0–1.2)
CO2: 21 mmol/L (ref 20–29)
Calcium: 9.7 mg/dL (ref 8.7–10.2)
Chloride: 103 mmol/L (ref 96–106)
Creatinine, Ser: 0.77 mg/dL (ref 0.57–1.00)
GFR calc Af Amer: 122 mL/min/{1.73_m2} (ref >59)
GFR calc non Af Amer: 106 mL/min/{1.73_m2} (ref >59)
Globulin, Total: 3.2 g/dL (ref 1.5–4.5)
Glucose: 86 mg/dL (ref 65–99)
Potassium: 4.4 mmol/L (ref 3.5–5.2)
Sodium: 137 mmol/L (ref 134–144)
Total Protein: 7.7 g/dL (ref 6.0–8.5)

## 2019-10-20 LAB — CBC WITH DIFFERENTIAL/PLATELET
Basophils Absolute: 0 10*3/uL (ref 0.0–0.2)
Basos: 1 %
EOS (ABSOLUTE): 0.3 10*3/uL (ref 0.0–0.4)
Eos: 4 %
Hematocrit: 43.4 % (ref 34.0–46.6)
Hemoglobin: 14.9 g/dL (ref 11.1–15.9)
Immature Grans (Abs): 0 10*3/uL (ref 0.0–0.1)
Immature Granulocytes: 0 %
Lymphocytes Absolute: 2.5 10*3/uL (ref 0.7–3.1)
Lymphs: 35 %
MCH: 32.2 pg (ref 26.6–33.0)
MCHC: 34.3 g/dL (ref 31.5–35.7)
MCV: 94 fL (ref 79–97)
Monocytes Absolute: 0.4 10*3/uL (ref 0.1–0.9)
Monocytes: 6 %
Neutrophils Absolute: 3.9 10*3/uL (ref 1.4–7.0)
Neutrophils: 54 %
Platelets: 290 10*3/uL (ref 150–450)
RBC: 4.63 x10E6/uL (ref 3.77–5.28)
RDW: 11.8 % (ref 11.7–15.4)
WBC: 7.1 10*3/uL (ref 3.4–10.8)

## 2019-10-20 LAB — LIPID PANEL
Chol/HDL Ratio: 3.1 ratio (ref 0.0–4.4)
Cholesterol, Total: 152 mg/dL (ref 100–199)
HDL: 49 mg/dL (ref >39)
LDL Chol Calc (NIH): 88 mg/dL (ref 0–99)
Triglycerides: 77 mg/dL (ref 0–149)
VLDL Cholesterol Cal: 15 mg/dL (ref 5–40)

## 2019-10-31 ENCOUNTER — Ambulatory Visit (INDEPENDENT_AMBULATORY_CARE_PROVIDER_SITE_OTHER): Payer: BC Managed Care – PPO | Admitting: Licensed Clinical Social Worker

## 2019-10-31 ENCOUNTER — Other Ambulatory Visit: Payer: Self-pay

## 2019-10-31 DIAGNOSIS — F411 Generalized anxiety disorder: Secondary | ICD-10-CM

## 2019-10-31 NOTE — BH Specialist Note (Signed)
Integrated Behavioral Health Visit via Telemedicine (Telephone)  10/31/2019 Linda Carrillo [id number]   Session Start time: 10:30 AM  Session End time: 10:50 AM Total time: 20  Referring Provider: NP Randa Evens Type of Visit: Telephonic Patient location: [facility] Provider location: Office All persons participating in visit: LCSW and Patient  Confirmed patient's address: Yes  Confirmed patient's phone number: Yes  Any changes to demographics: No   Confirmed patient's insurance: Yes  Any changes to patient's insurance: No   Discussed confidentiality: Yes    The following statements were read to the patient and/or legal guardian that are established with the [facility] Provider.  "The purpose of this phone visit is to provide behavioral health care while limiting exposure to the coronavirus (COVID19).  There is a possibility of technology failure and discussed alternative modes of communication if that failure occurs."  "By engaging in this telephone visit, you consent to the provision of healthcare.  Additionally, you authorize for your insurance to be billed for the services provided during this telephone visit."   Patient and/or legal guardian consented to telephone visit: Yes   PRESENTING CONCERNS: Patient and/or family reports the following symptoms/concerns: Pt has been diagnosed with depression and anxiety.   Duration of problem: Ongoing; Severity of problem: moderate  STRENGTHS (Protective Factors/Coping Skills): Implemented workout routine to assist with sleep Has good insight Pt has hx of participating in medication management therapy  GOALS ADDRESSED: Patient will: 1.  Reduce symptoms of: agitation and anxiety  2.  Increase knowledge and/or ability of: coping skills  3.  Demonstrate ability to: Increase healthy adjustment to current life circumstances and Increase adequate support systems for patient/family  INTERVENTIONS: Interventions utilized:   Solution-Focused Strategies, Supportive Counseling and Psychoeducation and/or Health Education Standardized Assessments completed: Not Needed  ASSESSMENT: Patient currently experiencing increase in irritability triggered by anxiety.   Patient may benefit from brief therapy. LCSW discussed correlation between one's physical and mental health. Healthy coping skills were discussed. Pt would like to focus on strengthening support system and managing irritability.   PLAN: 1. Follow up with behavioral health clinician on : Pt will contact insurance to inquire about co-pay and will schedule appointment for brief therapy.  2. Behavioral recommendations: Continue to utilize healthy coping skills  3. Referral(s): Integrated [employer] (In Clinic)  [name], [place] 10/31/2019 11:09 AM

## 2019-11-07 ENCOUNTER — Ambulatory Visit: Payer: BC Managed Care – PPO | Attending: Internal Medicine

## 2019-11-07 DIAGNOSIS — Z23 Encounter for immunization: Secondary | ICD-10-CM

## 2019-11-07 NOTE — Progress Notes (Signed)
   Covid-19 Vaccination Clinic  Name:  Linda Carrillo    MRN: [id number] DOB: 1991-06-13  11/07/2019  Linda Carrillo was observed post Covid-19 immunization for 15 minutes without incident. She was provided with Vaccine Information Sheet and instruction to access the V-Safe system.   Linda Carrillo was instructed to call 911 with any severe reactions post vaccine: Marland Kitchen Difficulty breathing  . Swelling of face and throat  . A fast heartbeat  . A bad rash all over body  . Dizziness and weakness   Immunizations Administered    Name Date Dose VIS Date Route   Pfizer COVID-19 Vaccine 11/07/2019  8:33 AM 0.3 mL 07/07/2019 Intramuscular   Manufacturer: [employer], [employer]   Lot: [id number]   NDC: [address]-6

## 2019-11-13 DIAGNOSIS — Z3042 Encounter for surveillance of injectable contraceptive: Secondary | ICD-10-CM | POA: Diagnosis not present

## 2019-12-14 ENCOUNTER — Telehealth (INDEPENDENT_AMBULATORY_CARE_PROVIDER_SITE_OTHER): Payer: BC Managed Care – PPO | Admitting: Primary Care

## 2019-12-14 DIAGNOSIS — K5900 Constipation, unspecified: Secondary | ICD-10-CM | POA: Diagnosis not present

## 2019-12-14 DIAGNOSIS — J4520 Mild intermittent asthma, uncomplicated: Secondary | ICD-10-CM

## 2019-12-14 MED ORDER — ALBUTEROL SULFATE HFA 108 (90 BASE) MCG/ACT IN AERS: 1.0000 | INHALATION_SPRAY | RESPIRATORY_TRACT | 1 refills | Status: DC | PRN

## 2019-12-14 MED ORDER — MONTELUKAST SODIUM 10 MG PO TABS: 10.0000 mg | ORAL_TABLET | Freq: Every day | ORAL | 3 refills | Status: DC

## 2019-12-14 MED ORDER — BUDESONIDE-FORMOTEROL FUMARATE 160-4.5 MCG/ACT IN AERO: 2.0000 | INHALATION_SPRAY | Freq: Two times a day (BID) | RESPIRATORY_TRACT | 3 refills | Status: DC

## 2019-12-14 NOTE — Progress Notes (Signed)
Virtual Visit via Telephone Note  I connected with Linda Carrillo on 12/14/19 at  3:50 PM EDT by telephone and verified that I am speaking with the correct person using two identifiers.   I discussed the limitations, risks, security and privacy concerns of performing an evaluation and management service by telephone and the availability of in person appointments. I also discussed with the patient that there may be a patient responsible charge related to this service. The patient expressed understanding and agreed to proceed.   History of Present Illness: [name] is having a tell visit for bloating and constipation she feels it has become worst after starting singular. Reviewed all three medications / Up to Date for side effects and spoke with Dr. Delford Field pulmonologist agree these  Asthma medications had no side affect of constipation. She is using over the counter medication that is not that affective.     Past Medical History:  Diagnosis Date  . Asthma    Current Outpatient Medications on File Prior to Visit  Medication Sig Dispense Refill  . albuterol (VENTOLIN HFA) 108 (90 Base) MCG/ACT inhaler Inhale 1-2 puffs into the lungs every 4 (four) hours as needed for wheezing or shortness of breath. 8 g 1  . budesonide-formoterol (SYMBICORT) 160-4.5 MCG/ACT inhaler Inhale 2 puffs into the lungs 2 (two) times daily. 1 Inhaler 3  . montelukast (SINGULAIR) 10 MG tablet Take 1 tablet (10 mg total) by mouth at bedtime. 90 tablet 3   No current facility-administered medications on file prior to visit.   Observations/Objective: Review of Systems  Gastrointestinal: Positive for constipation.       Bloating    Assessment and Plan: Diagnoses and all orders for this visit:  Constipation, unspecified constipation type MANAGEMENT OF CHRONIC CONSTIPATION  Drink fluids in the recommended amount everyday. Recommend amount is 8 cups of water daily. Do not replace water with Gatorade or  Powerade as these should only be used when you are dehydrated.  Eat lots of high fiber foods-fruits, veggies, bran and whole grain instead of white bread Refer to GI  Mild intermittent asthma, unspecified whether complicated Asthma is managed with singular 10 mg daily , Symbicort 160/4.5 2 puffs daily  And albuterol rescue inhaler as needed.   Asthma can include cough, wheeze, and shortness of breath brought on by characteristic triggers and relieved by bronchodilating medications.    Follow Up Instructions:    I discussed the assessment and treatment plan with the patient. The patient was provided an opportunity to ask questions and all were answered. The patient agreed with the plan and demonstrated an understanding of the instructions.   The patient was advised to call back or seek an in-person evaluation if the symptoms worsen or if the condition fails to improve as anticipated.  I provided 10 minutes of non-face-to-face time during this encounter.   Grayce Sessions, NP

## 2019-12-28 DIAGNOSIS — R140 Abdominal distension (gaseous): Secondary | ICD-10-CM | POA: Diagnosis not present

## 2019-12-28 DIAGNOSIS — E669 Obesity, unspecified: Secondary | ICD-10-CM | POA: Diagnosis not present

## 2019-12-28 DIAGNOSIS — K5904 Chronic idiopathic constipation: Secondary | ICD-10-CM | POA: Diagnosis not present

## 2020-01-22 DIAGNOSIS — Z3042 Encounter for surveillance of injectable contraceptive: Secondary | ICD-10-CM | POA: Diagnosis not present

## 2020-02-13 ENCOUNTER — Ambulatory Visit (INDEPENDENT_AMBULATORY_CARE_PROVIDER_SITE_OTHER): Payer: Self-pay

## 2020-02-13 NOTE — Telephone Encounter (Signed)
Patient called and says she's been having SOB since last Wednesday or Thursday and it's not getting better. She says she's not SOB at rest, but it's mild when she's walking around exerting herself. She says her chest feels tight when exerting herself or taking a deep breath. She says she's using her inhaler more than her daily use. She denies fever, chills, dizziness or any other symptoms. She says she thinks it may her asthma or allergies. Per DT, MyChart video visit, appointment scheduled for tomorrow at 1030 with Gwinda Passe, NP, care advice given, she verbalized understanding.  Reason for Disposition . [1] MILD longstanding difficulty breathing AND [2]  SAME as normal  Answer Assessment - Initial Assessment Questions 1. RESPIRATORY STATUS: "Describe your breathing?" (e.g., wheezing, shortness of breath, unable to speak, severe coughing)      SOB 2. ONSET: "When did this breathing problem begin?"      Wednesday or Thursday last week 3. PATTERN "Does the difficult breathing come and go, or has it been constant since it started?"      Comes and goes; worse with exertion 4. SEVERITY: "How bad is your breathing?" (e.g., mild, moderate, severe)    - MILD: No SOB at rest, mild SOB with walking, speaks normally in sentences, can lay down, no retractions, pulse < 100.    - MODERATE: SOB at rest, SOB with minimal exertion and prefers to sit, cannot lie down flat, speaks in phrases, mild retractions, audible wheezing, pulse 100-120.    - SEVERE: Very SOB at rest, speaks in single words, struggling to breathe, sitting hunched forward, retractions, pulse > 120      Mild 5. RECURRENT SYMPTOM: "Have you had difficulty breathing before?" If Yes, ask: "When was the last time?" and "What happened that time?"      Yes, last time was 2020, using inhaler a lot, went to UC, nebulizer 6. CARDIAC HISTORY: "Do you have any history of heart disease?" (e.g., heart attack, angina, bypass surgery, angioplasty)       No 7. LUNG HISTORY: "Do you have any history of lung disease?"  (e.g., pulmonary embolus, asthma, emphysema)     Asthma 8. CAUSE: "What do you think is causing the breathing problem?"      I think it may be weather change or allergies 9. OTHER SYMPTOMS: "Do you have any other symptoms? (e.g., dizziness, runny nose, cough, chest pain, fever)     Chest tightness when moving around and taking deep breaths 10. PREGNANCY: "Is there any chance you are pregnant?" "When was your last menstrual period?"       No 11. TRAVEL: "Have you traveled out of the country in the last month?" (e.g., travel history, exposures)       No  Protocols used: BREATHING DIFFICULTY-A-AH

## 2020-02-14 ENCOUNTER — Telehealth (INDEPENDENT_AMBULATORY_CARE_PROVIDER_SITE_OTHER): Payer: BC Managed Care – PPO | Admitting: Primary Care

## 2020-02-14 DIAGNOSIS — J4520 Mild intermittent asthma, uncomplicated: Secondary | ICD-10-CM | POA: Diagnosis not present

## 2020-02-14 MED ORDER — ALBUTEROL SULFATE HFA 108 (90 BASE) MCG/ACT IN AERS: 1.0000 | INHALATION_SPRAY | RESPIRATORY_TRACT | 1 refills | Status: DC | PRN

## 2020-02-14 MED ORDER — BUDESONIDE-FORMOTEROL FUMARATE 160-4.5 MCG/ACT IN AERO: 2.0000 | INHALATION_SPRAY | Freq: Two times a day (BID) | RESPIRATORY_TRACT | 3 refills | Status: DC

## 2020-02-14 MED ORDER — PREDNISONE 10 MG PO TABS: ORAL_TABLET | ORAL | 0 refills | Status: DC

## 2020-02-14 MED ORDER — MONTELUKAST SODIUM 10 MG PO TABS: 10.0000 mg | ORAL_TABLET | Freq: Every day | ORAL | 3 refills | Status: DC

## 2020-02-14 NOTE — Progress Notes (Signed)
I connected with  Linda Carrillo by virtual  and verified that I am speaking with the correct person using two identifiers. Patient is at work  I discussed the limitations, risks, security and privacy concerns of performing an evaluation and management service by telephone and the availability of in person appointments. I also discussed with the patient that there may be a patient responsible charge related to this service. The patient expressed understanding and agreed to proceed.  Gwinda Passe Np-C at office  History of Present Illness: [name] is a 29 year old female with concerns of shortness of breath and using albuterol more any exertion. She did experience 1 day with bilateral ankle swelling relieved with ice. No future swelling  Observations/Objective: Review of Systems  Respiratory: Positive for shortness of breath and wheezing.   All other systems reviewed and are negative.  Assessment and Plan: Diagnoses and all orders for this visit:  Mild intermittent asthma, unspecified whether complicated She has  classic symptoms which include cough, wheeze, and shortness of breath brought on by characteristic triggers which is exertion /walking  -     budesonide-formoterol (SYMBICORT) 160-4.5 MCG/ACT inhaler; Inhale 2 puffs into the lungs 2 (two) times daily. -     albuterol (VENTOLIN HFA) 108 (90 Base) MCG/ACT inhaler; Inhale 1-2 puffs into the lungs every 4 (four) hours as needed for wheezing or shortness of breath. -     montelukast (SINGULAIR) 10 MG tablet; Take 1 tablet (10 mg total) by mouth at bedtime. -     Ambulatory referral to Pulmonology  Other orders -     predniSONE (DELTASONE) 10 MG tablet; Prednisone dose pack directions Day 1 take 6 tablets, Day 2 take 5 tablets, Day 3 take 4 tablets  Day 4 take 3 tablets Day 2 take 2  tablet and the llast day take 1 tablet  Dispense 21 tablets    Follow Up Instructions:    I discussed the assessment and treatment  plan with the patient. The patient was provided an opportunity to ask questions and all were answered. The patient agreed with the plan and demonstrated an understanding of the instructions.   The patient was advised to call back or seek an in-person evaluation if the symptoms worsen or if the condition fails to improve as anticipated.  I provided 12 minutes of non-face-to-face time during this encounter.   Grayce Sessions, NP

## 2020-02-15 ENCOUNTER — Ambulatory Visit (HOSPITAL_COMMUNITY)
Admission: EM | Admit: 2020-02-15 | Discharge: 2020-02-15 | Disposition: A | Discharge status: Home / Self Care | Payer: BC Managed Care – PPO | Attending: Physician Assistant | Admitting: Physician Assistant

## 2020-02-15 ENCOUNTER — Other Ambulatory Visit: Payer: Self-pay

## 2020-02-15 ENCOUNTER — Encounter (HOSPITAL_COMMUNITY): Payer: Self-pay | Admitting: Emergency Medicine

## 2020-02-15 DIAGNOSIS — R0602 Shortness of breath: Secondary | ICD-10-CM

## 2020-02-15 DIAGNOSIS — J45901 Unspecified asthma with (acute) exacerbation: Secondary | ICD-10-CM | POA: Insufficient documentation

## 2020-02-15 DIAGNOSIS — J069 Acute upper respiratory infection, unspecified: Secondary | ICD-10-CM | POA: Insufficient documentation

## 2020-02-15 DIAGNOSIS — T148XXA Other injury of unspecified body region, initial encounter: Secondary | ICD-10-CM | POA: Diagnosis not present

## 2020-02-15 DIAGNOSIS — R21 Rash and other nonspecific skin eruption: Secondary | ICD-10-CM | POA: Insufficient documentation

## 2020-02-15 LAB — POCT RAPID STREP A: Streptococcus, Group A Screen (Direct): NEGATIVE

## 2020-02-15 MED ORDER — CETIRIZINE HCL 10 MG PO TABS: 10.0000 mg | ORAL_TABLET | Freq: Every day | ORAL | 0 refills | Status: DC

## 2020-02-15 MED ORDER — FLUTICASONE PROPIONATE 50 MCG/ACT NA SUSP: 1.0000 | Freq: Every day | NASAL | 1 refills | Status: AC

## 2020-02-15 NOTE — ED Triage Notes (Signed)
Pt c/o tightness and redness and swelling on her throat, there seems to be a bite on the left side. Pt states she started feeling SOB last week, which she thought was her asthma. Pt states she had a video visit yesterday and was prescribed prednisone. She has not picked it up from the pharmacy yet.

## 2020-02-15 NOTE — ED Provider Notes (Addendum)
[facility]    CSN: [id number] Arrival date & time: 02/15/20  1643      History   Chief Complaint Chief Complaint  Patient presents with  . Insect Bite  . Shortness of Breath    HPI Linda Carrillo is a 29 y.o. female.   Patient reports urgent care for swelling in her throat.  She reports she feels she has swelling in her glands in her throat.  She noticed this today.  She was concerned as she noticed what she thought may be a bug bite on the left side of her neck.  She reports over the last week she has had shortness of breath that has been attributed to her asthma.  She does endorse some recent congestion and sneezing as well.  Denies significant cough.  No nausea, vomiting or diarrhea.  No headaches.  She had a virtual visit with her primary care yesterday and was given medications to treat an asthma exacerbation, she has not obtained the prednisone yet.  She has been utilizing her inhaler with good effect with her shortness of breath.  She primarily reports today out of concern if she is having any sort of allergic reaction to a bite or an infection.  She denies fever or chills.  She has been eating and drinking without issue.  Denies much throat pain just endorses swelling feeling.  No difficulty swallowing.  She has not had any other skin rashes.  Otherwise in her usual state of health.       Past Medical History:  Diagnosis Date  . Asthma     Patient Active Problem List   Diagnosis Date Noted  . Pap smear for cervical cancer screening 05/22/2019    History reviewed. No pertinent surgical history.  OB History   No obstetric history on file.      Home Medications    Prior to Admission medications   Medication Sig Start Date End Date Taking? Authorizing Provider  albuterol (VENTOLIN HFA) 108 (90 Base) MCG/ACT inhaler Inhale 1-2 puffs into the lungs every 4 (four) hours as needed for wheezing or shortness of breath. 02/14/20  Yes Grayce Sessions, NP    budesonide-formoterol (SYMBICORT) 160-4.5 MCG/ACT inhaler Inhale 2 puffs into the lungs 2 (two) times daily. 02/14/20  Yes Grayce Sessions, NP  montelukast (SINGULAIR) 10 MG tablet Take 1 tablet (10 mg total) by mouth at bedtime. 02/14/20  Yes Grayce Sessions, NP  cetirizine (ZYRTEC ALLERGY) 10 MG tablet Take 1 tablet (10 mg total) by mouth daily. 02/15/20   Falisha Osment, Veryl Speak, PA-C  fluticasone (FLONASE) 50 MCG/ACT nasal spray Place 1 spray into both nostrils daily. 02/15/20   Telesha Deguzman, Veryl Speak, PA-C  predniSONE (DELTASONE) 10 MG tablet Prednisone dose pack directions Day 1 take 6 tablets, Day 2 take 5 tablets, Day 3 take 4 tablets  Day 4 take 3 tablets Day 2 take 2  tablet and the llast day take 1 tablet  Dispense 21 tablets 02/14/20   Grayce Sessions, NP    Family History Family History  Problem Relation Age of Onset  . Healthy Mother   . Healthy Father     Social History Social History   Tobacco Use  . Smoking status: Never Smoker  . Smokeless tobacco: Never Used  Vaping Use  . Vaping Use: Never used  Substance Use Topics  . Alcohol use: No  . Drug use: Not on file     Allergies   Patient has no  known allergies.   Review of Systems Review of Systems   Physical Exam Triage Vital Signs ED Triage Vitals  Enc Vitals Group     BP 02/15/20 1749 (!) 134/83     Pulse Rate 02/15/20 1749 98     Resp 02/15/20 1749 18     Temp 02/15/20 1749 97.6 F (36.4 C)     Temp Source 02/15/20 1749 Oral     SpO2 02/15/20 1749 98 %     Weight --      Height --      Head Circumference --      Peak Flow --      Pain Score 02/15/20 1745 0     Pain Loc --      Pain Edu? --      Excl. in GC? --    No data found.  Updated Vital Signs BP (!) 134/83 (BP Location: Left Arm)   Pulse 98   Temp 97.6 F (36.4 C) (Oral)   Resp 18   SpO2 98%   Visual Acuity Right Eye Distance:   Left Eye Distance:   Bilateral Distance:    Right Eye Near:   Left Eye Near:    Bilateral Near:      Physical Exam Vitals and nursing note reviewed.  Constitutional:      General: She is not in acute distress.    Appearance: She is well-developed. She is not ill-appearing.  HENT:     Head: Normocephalic and atraumatic.     Nose: Congestion and rhinorrhea present.     Mouth/Throat:     Comments: Mild erythema, there is postnasal drip.  Tonsils 1+  No stridor or significant oropharyngeal swelling. Eyes:     Extraocular Movements: Extraocular movements intact.     Conjunctiva/sclera: Conjunctivae normal.     Pupils: Pupils are equal, round, and reactive to light.  Neck:     Comments: Small patch of erythema on the left aspect of the neck.  Blanching nonraised.  This is located next to a few spots of acneform rash. Cardiovascular:     Rate and Rhythm: Normal rate and regular rhythm.     Heart sounds: No murmur heard.   Pulmonary:     Effort: Pulmonary effort is normal. No tachypnea, accessory muscle usage or respiratory distress.     Breath sounds: Normal breath sounds. No decreased breath sounds, wheezing, rhonchi or rales.  Abdominal:     Palpations: Abdomen is soft.     Tenderness: There is no abdominal tenderness.  Musculoskeletal:     Cervical back: Neck supple.  Lymphadenopathy:     Cervical: Cervical adenopathy (Shotty adenopathy left greater than right, important to note above the area of rash.) present.  Skin:    General: Skin is warm and dry.     Comments: There is no rash outside of the area of concern left side of the neck.  Neurological:     General: No focal deficit present.     Mental Status: She is alert.      UC Treatments / Results  Labs (all labs ordered are listed, but only abnormal results are displayed) Labs Reviewed  CULTURE, GROUP A STREP [facility])  POCT RAPID STREP A    EKG   Radiology No results found.  Procedures Procedures (including critical care time)  Medications Ordered in UC Medications - No data to display  Initial  Impression / Assessment and Plan / UC Course  I have reviewed the triage  vital signs and the nursing notes.  Pertinent labs & imaging results that were available during my care of the patient were reviewed by me and considered in my medical decision making (see chart for details).     #URI #Rash #Mild asthma exacerbation Patient is a 29 year old with history of asthma presenting with what appears to be a viral URI with mild asthma exacerbation.  Feel the rash is likely skin irritation as opposed to insect bite.  Feel that the sensation in her throat is secondary to postnasal drip and lymphadenopathy.  Rapid strep was negative.  Afebrile and saturating well in clinic.  Will recommend she take the prednisone as prescribed by her primary care provider, albuterol as needed.  Will recommend Flonase and Zyrtec.  Discussed Covid testing however patient states she was going vaccinated and would like to defer this, feel this is reasonable.  Strict follow-up and return precautions.  Instructed to follow-up witH PCP next week.  Patient verbalized understanding plan of care. Final Clinical Impressions(s) / UC Diagnoses   Final diagnoses:  Upper respiratory tract infection, unspecified type  Asthma with acute exacerbation, unspecified asthma severity, unspecified whether persistent  Rash     Discharge Instructions     Take the prednisone as prescribed by your PCP Start: Flonase daily Zyrtec daily  Albuterol as needed  If acutely worsening, fevers, difficulty breathing return or go to the Emergency department   Follow up with PCP next week for check up      ED Prescriptions    Medication Sig Dispense Auth. Provider   fluticasone (FLONASE) 50 MCG/ACT nasal spray Place 1 spray into both nostrils daily. 11.1 mL Mikenzie Mccannon, Veryl Speak, PA-C   cetirizine (ZYRTEC ALLERGY) 10 MG tablet Take 1 tablet (10 mg total) by mouth daily. 30 tablet Anacaren Kohan, Veryl Speak, PA-C     PDMP not reviewed this encounter.    Hermelinda Medicus, PA-C 02/15/20 2254    Carleton Vanvalkenburgh, Veryl Speak, PA-C 02/15/20 2256

## 2020-02-15 NOTE — Discharge Instructions (Addendum)
Take the prednisone as prescribed by your PCP Start: Flonase daily Zyrtec daily  Albuterol as needed  If acutely worsening, fevers, difficulty breathing return or go to the Emergency department   Follow up with PCP next week for check up

## 2020-02-16 ENCOUNTER — Other Ambulatory Visit: Payer: Self-pay

## 2020-02-16 MED ORDER — PREDNISONE 10 MG PO TABS: ORAL_TABLET | ORAL | 0 refills | Status: DC

## 2020-02-18 LAB — CULTURE, GROUP A STREP (THRC)

## 2020-02-19 ENCOUNTER — Other Ambulatory Visit (INDEPENDENT_AMBULATORY_CARE_PROVIDER_SITE_OTHER): Payer: Self-pay | Admitting: Primary Care

## 2020-02-19 NOTE — Addendum Note (Signed)
Addended by: Garrison Columbus on: 02/19/2020 12:22 PM   Modules accepted: Orders

## 2020-02-19 NOTE — Telephone Encounter (Signed)
PT need a refill  predniSONE (DELTASONE) 10 MG tablet [[id number]] [id number] - [place] - [address]  [address] [place] [address]  Phone: [phone] Fax: [phone]

## 2020-02-19 NOTE — Telephone Encounter (Signed)
Requested medication (s) are due for refill today: no  Requested medication (s) are on the active medication list: yes  Last refill:  02/16/20 (was printed and not sent electronically)  Future visit scheduled: no  Notes to clinic:  med not delegated to NT to RF or refuse   Requested Prescriptions  Pending Prescriptions Disp Refills   predniSONE (DELTASONE) 10 MG tablet 21 tablet 0    Sig: Prednisone dose pack directions Day 1 take 6 tablets, Day 2 take 5 tablets, Day 3 take 4 tablets  Day 4 take 3 tablets Day 2 take 2  tablet and the llast day take 1 tablet  Dispense 21 tablets      Not Delegated - Endocrinology:  Oral Corticosteroids Failed - 02/19/2020 12:25 PM      Failed - This refill cannot be delegated      Passed - Last BP in normal range    BP Readings from Last 1 Encounters:  02/15/20 (!) 134/83          Passed - Valid encounter within last 6 months    Recent Outpatient Visits           5 days ago Mild intermittent asthma, unspecified whether complicated   [facility] Grayce Sessions, NP   2 months ago Constipation, unspecified constipation type   [facility] Grayce Sessions, NP   4 months ago Encounter to establish care   [facility] Grayce Sessions, NP

## 2020-02-19 NOTE — Addendum Note (Signed)
Addended by: Lois Huxley, Jeannett Senior L on: 02/19/2020 01:39 PM   Modules accepted: Orders

## 2020-04-03 DIAGNOSIS — Z3042 Encounter for surveillance of injectable contraceptive: Secondary | ICD-10-CM | POA: Diagnosis not present

## 2020-05-14 ENCOUNTER — Ambulatory Visit: Payer: BC Managed Care – PPO | Admitting: Pulmonary Disease

## 2020-05-14 ENCOUNTER — Other Ambulatory Visit: Payer: Self-pay

## 2020-05-14 ENCOUNTER — Encounter: Payer: Self-pay | Admitting: Pulmonary Disease

## 2020-05-14 ENCOUNTER — Ambulatory Visit (INDEPENDENT_AMBULATORY_CARE_PROVIDER_SITE_OTHER): Payer: BC Managed Care – PPO

## 2020-05-14 VITALS — BP 108/62 | HR 87 | Temp 96.2°F | Ht 63.0 in | Wt 191.4 lb

## 2020-05-14 DIAGNOSIS — R059 Cough, unspecified: Secondary | ICD-10-CM | POA: Diagnosis not present

## 2020-05-14 DIAGNOSIS — K219 Gastro-esophageal reflux disease without esophagitis: Secondary | ICD-10-CM

## 2020-05-14 DIAGNOSIS — J45909 Unspecified asthma, uncomplicated: Secondary | ICD-10-CM

## 2020-05-14 DIAGNOSIS — J4520 Mild intermittent asthma, uncomplicated: Secondary | ICD-10-CM | POA: Diagnosis not present

## 2020-05-14 LAB — CBC WITH DIFFERENTIAL/PLATELET
Basophils Absolute: 0 10*3/uL (ref 0.0–0.1)
Basophils Relative: 0.7 % (ref 0.0–3.0)
Eosinophils Absolute: 0.3 10*3/uL (ref 0.0–0.7)
Eosinophils Relative: 4.7 % (ref 0.0–5.0)
HCT: 43.4 % (ref 36.0–46.0)
Hemoglobin: 14.6 g/dL (ref 12.0–15.0)
Lymphocytes Relative: 33.9 % (ref 12.0–46.0)
Lymphs Abs: 2.5 10*3/uL (ref 0.7–4.0)
MCHC: 33.6 g/dL (ref 30.0–36.0)
MCV: 95 fl (ref 78.0–100.0)
Monocytes Absolute: 0.3 10*3/uL (ref 0.1–1.0)
Monocytes Relative: 4.6 % (ref 3.0–12.0)
Neutro Abs: 4.1 10*3/uL (ref 1.4–7.7)
Neutrophils Relative %: 56.1 % (ref 43.0–77.0)
Platelets: 283 10*3/uL (ref 150.0–400.0)
RBC: 4.57 Mil/uL (ref 3.87–5.11)
RDW: 12.6 % (ref 11.5–15.5)
WBC: 7.3 10*3/uL (ref 4.0–10.5)

## 2020-05-14 MED ORDER — OMEPRAZOLE 20 MG PO CPDR: 20.0000 mg | DELAYED_RELEASE_CAPSULE | Freq: Every day | ORAL | 11 refills | Status: AC

## 2020-05-14 MED ORDER — ALBUTEROL SULFATE HFA 108 (90 BASE) MCG/ACT IN AERS: 1.0000 | INHALATION_SPRAY | RESPIRATORY_TRACT | 1 refills | Status: AC | PRN

## 2020-05-14 NOTE — Patient Instructions (Signed)
Start Omeprazole 20mg  daily for reflux symptoms Use flonase 1 spray per nostril daily (during heavy allergy seasons) Use zyrtec daily during allergy seasons Continue montelukast daily Continue symbicort 2 puffs twice daily with spacer Use albuterol inhaler 1-2 puffs every 4-6 hours as needed We will check a chest x-ray and lab work today We will schedule you for breathing tests at your convenience Call in 3-4 weeks if your current symptoms are not improved and we will discuss adding another inhaler at that time.

## 2020-05-14 NOTE — Progress Notes (Signed)
Synopsis: Referred by Gwinda Passe, NP for Asthma  Subjective:   PATIENT ID: Linda Carrillo GENDER: female DOB: [birth year], MRN: [id number]   HPI  Chief Complaint  Patient presents with   Consult    Asthma since 2015.C/o sob and chest tightness with panic attacks and random times.Worse with lying down. Cough yellow and white   Linda Carrillo is a 29 year old woman, never smoker with history of asthma who is referred to pulmonary clinic for further evaluation of her asthma.   She reports being diagnosed with asthma in 2015. She is currently taking symbicort 160-4.14mcg 2 puffs twice daily, albuterol PRN 2 times per day and montelukast (started 01/2020). She has not required prednisone in the past 2 years. She was prescribed prednisone in July 2021 but never received this medication and her symptoms resolved with inhaler therapy alone. She has night time awakenings about 2-3 times per week with cough and shortness of breath. She also has anxiety with panic attacks which also add to episodes of shortness of breath.   She reports chest tightness, wheezing and dyspnea with exercise, cold air, strong perfumes/colognes and seasonal allergies predominately in the Fall and Spring. She denies exposure to second hand smoke while growing up.   Her mother has asthma that is rleated to seasonal allergies and her sister has asthma as well that is controlled with as needed albuterol.   She has sinus congestion and drainage during allergy seasons with post-nasal drip symptoms. She also complains of heartburn on a weekly basis. She notices her cough and breathing are worse during times when her sinuses and heartburn are active.  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  Past Medical History:  Diagnosis Date   Allergic rhinitis    Asthma      Family History  Problem Relation Age of Onset   Healthy Mother    Asthma Mother    Healthy Father    Asthma  Sister    Pancreatic cancer Paternal Grandmother      Social History   Socioeconomic History   Marital status: Single    Spouse name: Not on file   Number of children: 0   Years of education: Not on file   Highest education level: Not on file  Occupational History   Not on file  Tobacco Use   Smoking status: Never Smoker   Smokeless tobacco: Never Used  Vaping Use   Vaping Use: Never used  Substance and Sexual Activity   Alcohol use: No   Drug use: Never   Sexual activity: Not on file  Other Topics Concern   Not on file  Social History Narrative   Works as [employer]   Lives with family.   Social Determinants of Health   Financial Resource Strain:    Difficulty of Paying Living Expenses: Not on file  Food Insecurity:    Worried About Programme researcher, broadcasting/film/video in the Last Year: Not on file   [employer] of Food in the Last Year: Not on file  Transportation Needs:    Lack of Transportation (Medical): Not on file   Lack of Transportation (Non-Medical): Not on file  Physical Activity:    Days of Exercise per Week: Not on file   Minutes of Exercise per Session: Not on file  Stress:    Feeling of Stress : Not on file  Social Connections:    Frequency of Communication with Friends and Family: Not on file  Frequency of Social Gatherings with Friends and Family: Not on file   Attends Religious Services: Not on file   Active Member of Clubs or Organizations: Not on file   Attends Banker Meetings: Not on file   Marital Status: Not on file  Intimate Partner Violence:    Fear of Current or Ex-Partner: Not on file   Emotionally Abused: Not on file   Physically Abused: Not on file   Sexually Abused: Not on file     No Known Allergies   Outpatient Medications Prior to Visit  Medication Sig Dispense Refill   budesonide-formoterol (SYMBICORT) 160-4.5 MCG/ACT inhaler Inhale 2 puffs into the lungs 2 (two) times daily. 1 Inhaler 3    cetirizine (ZYRTEC ALLERGY) 10 MG tablet Take 1 tablet (10 mg total) by mouth daily. 30 tablet 0   fluticasone (FLONASE) 50 MCG/ACT nasal spray Place 1 spray into both nostrils daily. 11.1 mL 1   montelukast (SINGULAIR) 10 MG tablet Take 1 tablet (10 mg total) by mouth at bedtime. 90 tablet 3   albuterol (VENTOLIN HFA) 108 (90 Base) MCG/ACT inhaler Inhale 1-2 puffs into the lungs every 4 (four) hours as needed for wheezing or shortness of breath. 8 g 1   predniSONE (DELTASONE) 10 MG tablet Prednisone dose pack directions Day 1 take 6 tablets, Day 2 take 5 tablets, Day 3 take 4 tablets  Day 4 take 3 tablets Day 2 take 2  tablet and the llast day take 1 tablet  Dispense 21 tablets (Patient not taking: Reported on 05/14/2020) 21 tablet 0   No facility-administered medications prior to visit.    Review of Systems  Constitutional: Negative for chills, diaphoresis, fever, malaise/fatigue and weight loss.  HENT: Negative for congestion and sore throat.   Eyes: Negative for blurred vision.  Respiratory: Positive for cough, sputum production, shortness of breath and wheezing. Negative for hemoptysis.   Cardiovascular: Negative for chest pain, palpitations, orthopnea, claudication, leg swelling and PND.  Gastrointestinal: Positive for heartburn. Negative for abdominal pain, diarrhea, nausea and vomiting.  Genitourinary: Negative.   Musculoskeletal: Negative.   Skin: Negative for itching and rash.  Neurological: Negative for dizziness, weakness and headaches.  Endo/Heme/Allergies: Does not bruise/bleed easily.  Psychiatric/Behavioral: The patient is nervous/anxious.     Objective:   Vitals:   05/14/20 0902  BP: 108/62  Pulse: 87  Temp: (!) 96.2 F (35.7 C)  TempSrc: Temporal  SpO2: 97%  Weight: 191 lb 6.4 oz (86.8 kg)  Height: 5\' 3"  (1.6 m)     Physical Exam Constitutional:      General: She is not in acute distress.    Appearance: Normal appearance. She is not ill-appearing.    HENT:     Head: Normocephalic and atraumatic.     Nose: Nose normal.     Mouth/Throat:     Mouth: Mucous membranes are moist.  Eyes:     General: No scleral icterus.    Conjunctiva/sclera: Conjunctivae normal.     Pupils: Pupils are equal, round, and reactive to light.  Cardiovascular:     Rate and Rhythm: Normal rate and regular rhythm.     Pulses: Normal pulses.     Heart sounds: Normal heart sounds. No murmur heard.   Pulmonary:     Effort: Pulmonary effort is normal. No respiratory distress.     Breath sounds: Normal breath sounds. No wheezing, rhonchi or rales.  Abdominal:     General: Bowel sounds are normal. There is no distension.  Palpations: Abdomen is soft.     Tenderness: There is no abdominal tenderness.  Musculoskeletal:     Cervical back: Neck supple.     Right lower leg: No edema.     Left lower leg: No edema.  Lymphadenopathy:     Cervical: No cervical adenopathy.  Skin:    General: Skin is warm and dry.  Neurological:     General: No focal deficit present.     Mental Status: She is alert and oriented to person, place, and time. Mental status is at baseline.  Psychiatric:        Mood and Affect: Mood normal.        Behavior: Behavior normal.        Thought Content: Thought content normal.        Judgment: Judgment normal.     CBC    Component Value Date/Time   WBC 7.3 05/14/2020 0949   RBC 4.57 05/14/2020 0949   HGB 14.6 05/14/2020 0949   HGB 14.9 10/19/2019 1140   HCT 43.4 05/14/2020 0949   HCT 43.4 10/19/2019 1140   PLT 283.0 05/14/2020 0949   PLT 290 10/19/2019 1140   MCV 95.0 05/14/2020 0949   MCV 94 10/19/2019 1140   MCH 32.2 10/19/2019 1140   MCH 31.9 08/14/2015 1932   MCHC 33.6 05/14/2020 0949   RDW 12.6 05/14/2020 0949   RDW 11.8 10/19/2019 1140   LYMPHSABS 2.5 05/14/2020 0949   LYMPHSABS 2.5 10/19/2019 1140   MONOABS 0.3 05/14/2020 0949   EOSABS 0.3 05/14/2020 0949   EOSABS 0.3 10/19/2019 1140   BASOSABS 0.0 05/14/2020 0949    BASOSABS 0.0 10/19/2019 1140    Chest imaging: CXR 05/14/20 The heart size and mediastinal contours are within normal limits. Both lungs are clear. The visualized skeletal structures are Unremarkable.  PFT: No PFTs on file  Labs: Reviewed as above.  Absolute Eosinophil count 300/uL 10/19/19  Assessment & Plan:   Asthma, unspecified asthma severity, unspecified whether complicated, unspecified whether persistent - Plan: Pulmonary Function Test, CBC with Differential, IgE, omeprazole (PRILOSEC) 20 MG capsule, albuterol (VENTOLIN HFA) 108 (90 Base) MCG/ACT inhaler, DG Chest 2 View, IgE  Gastroesophageal reflux disease without esophagitis  Mild intermittent asthma, unspecified whether complicated  Discussion: Maija Biggers is a 29 year old woman, never smoker with history of asthma who is referred to pulmonary clinic for further evaluation of her asthma.   She appears to have moderate persistent asthma given reports of her nighttime awakenings and her limitations with exercise. She is currently on symbicort 160-4.96mcg 2 puffs twice daily, montelukast 10mg  daily and as needed albuterol inhaler. She has aggravating factors to her asthma which include GERD and sinus disease with post-nasal drip. We will treat her aggravating factors first before stepping up her inhaler therapy. She is to use flonase nasal spray daily and also take zyrtec daily for her allergy symptoms. I have sent in a prescription for omeprazole 20mg  daily for her GERD.   We will obtain pulmonary function tests in the near future. Based on her PFTs and also her clinical symptoms after treating the GERD and sinus drainage, we can consider adding LAMA therapy next. She also has elevated eosinophil count of 300/uL on 10/19/19 and again on today's labs. We can consider biologic therapy in the future if she continues to have symptoms despite maximal inhaler therapy.    Follow up in 3 months  , MD [facility] Office: [phone]  Current Outpatient Medications:    albuterol (VENTOLIN HFA) 108 (90 Base) MCG/ACT inhaler, Inhale 1-2 puffs into the lungs every 4 (four) hours as needed for wheezing or shortness of breath., Disp: 8 g, Rfl: 1   budesonide-formoterol (SYMBICORT) 160-4.5 MCG/ACT inhaler, Inhale 2 puffs into the lungs 2 (two) times daily., Disp: 1 Inhaler, Rfl: 3   cetirizine (ZYRTEC ALLERGY) 10 MG tablet, Take 1 tablet (10 mg total) by mouth daily., Disp: 30 tablet, Rfl: 0   fluticasone (FLONASE) 50 MCG/ACT nasal spray, Place 1 spray into both nostrils daily., Disp: 11.1 mL, Rfl: 1   montelukast (SINGULAIR) 10 MG tablet, Take 1 tablet (10 mg total) by mouth at bedtime., Disp: 90 tablet, Rfl: 3   omeprazole (PRILOSEC) 20 MG capsule, Take 1 capsule (20 mg total) by mouth daily., Disp: 30 capsule, Rfl: 11

## 2020-05-15 LAB — IGE: IgE (Immunoglobulin E), Serum: 172 kU/L — ABNORMAL HIGH (ref <=114)

## 2020-05-16 ENCOUNTER — Ambulatory Visit (INDEPENDENT_AMBULATORY_CARE_PROVIDER_SITE_OTHER): Payer: BC Managed Care – PPO | Admitting: Pulmonary Disease

## 2020-05-16 ENCOUNTER — Other Ambulatory Visit: Payer: Self-pay

## 2020-05-16 DIAGNOSIS — J45909 Unspecified asthma, uncomplicated: Secondary | ICD-10-CM

## 2020-05-16 LAB — PULMONARY FUNCTION TEST
DL/VA % pred: 119 %
DL/VA: 5.52 ml/min/mmHg/L
DLCO cor % pred: 127 %
DLCO cor: 28.54 ml/min/mmHg
DLCO unc % pred: 131 %
DLCO unc: 29.54 ml/min/mmHg
FEF 25-75 Post: 1.72 L/sec
FEF 25-75 Pre: 1.45 L/sec
FEF2575-%Change-Post: 18 %
FEF2575-%Pred-Post: 48 %
FEF2575-%Pred-Pre: 41 %
FEV1-%Change-Post: 8 %
FEV1-%Pred-Post: 78 %
FEV1-%Pred-Pre: 71 %
FEV1-Post: 2.52 L
FEV1-Pre: 2.31 L
FEV1FVC-%Change-Post: 8 %
FEV1FVC-%Pred-Pre: 76 %
FEV6-%Change-Post: 0 %
FEV6-%Pred-Post: 94 %
FEV6-%Pred-Pre: 94 %
FEV6-Post: 3.57 L
FEV6-Pre: 3.55 L
FEV6FVC-%Change-Post: 0 %
FEV6FVC-%Pred-Post: 100 %
FEV6FVC-%Pred-Pre: 100 %
FVC-%Change-Post: 0 %
FVC-%Pred-Post: 94 %
FVC-%Pred-Pre: 93 %
FVC-Post: 3.57 L
FVC-Pre: 3.55 L
Post FEV1/FVC ratio: 71 %
Post FEV6/FVC ratio: 100 %
Pre FEV1/FVC ratio: 65 %
Pre FEV6/FVC Ratio: 100 %
RV % pred: 134 %
RV: 1.81 L
TLC % pred: 111 %
TLC: 5.65 L

## 2020-05-16 NOTE — Progress Notes (Signed)
PFT done today. 

## 2020-05-22 ENCOUNTER — Telehealth: Payer: Self-pay | Admitting: Pulmonary Disease

## 2020-05-22 NOTE — Telephone Encounter (Signed)
Please let patient know she has mild obstructive defect on pulmonary function test that is reversible with bronchodilator therapy. Check with her if she has noted any improvement in her breathing since treating her sinuses and GERD.   Thanks,  Melody Comas, MD [facility] Office: [phone]   See Amion for Pager Details

## 2020-05-23 NOTE — Telephone Encounter (Signed)
LMTCB x1 for pt.  

## 2020-06-04 ENCOUNTER — Telehealth (INDEPENDENT_AMBULATORY_CARE_PROVIDER_SITE_OTHER): Payer: BC Managed Care – PPO | Admitting: Primary Care

## 2020-06-04 DIAGNOSIS — J4520 Mild intermittent asthma, uncomplicated: Secondary | ICD-10-CM | POA: Diagnosis not present

## 2020-06-04 DIAGNOSIS — J302 Other seasonal allergic rhinitis: Secondary | ICD-10-CM | POA: Diagnosis not present

## 2020-06-04 MED ORDER — FEXOFENADINE-PSEUDOEPHED ER 180-240 MG PO TB24: 1.0000 | ORAL_TABLET | Freq: Every day | ORAL | 1 refills | Status: AC

## 2020-06-04 NOTE — Progress Notes (Signed)
Virtual Visit   I connected with Linda Carrillo on 06/04/20 at  3:10 PM EST by telephone and verified that I am speaking with the correct person using two identifiers.  Location: Patient: work Provider: Grayce Sessions @[facility]    I discussed the limitations, risks, security and privacy concerns of performing an evaluation and management service by telephone and the availability of in person appointments. I also discussed with the patient that there may be a patient responsible charge related to this service. The patient expressed understanding and agreed to proceed.   History of Present Illness: Linda Carrillo is a 29 year old female who will be  Evaluation of possible allergic rhinitis. Patient's symptoms include cough, watery eyes, nasal congestion, sinus pressure and sneezing. These symptoms are seasonal. Current triggers include exposure to weather changes. The patient has been suffering from these symptoms for approximately 2 weeks. The patient has tried over the counter medications  Nasal spray with fair relief of symptoms. Immunotherapy has never been tried. The patient has never had nasal polyps. The patient has no history of asthma. The patient has a history of asthma. The patient does not suffer from frequent sinopulmonary infections. The patient has not had sinus surgery in the past.  Observations/Objective: Review of Systems  HENT: Positive for congestion and sinus pain.   Eyes: Positive for discharge.  Respiratory: Positive for sputum production, shortness of breath and wheezing.   All other systems reviewed and are negative.   Assessment and Plan: Diagnoses and all orders for this visit:  Mild intermittent asthma, unspecified whether complicated Followed by pulmonologist   164.52 puffs twice daily and albuterol as needed and Singulair daily.  She states this has improved with her asthma  flareups  Seasonal allergic rhinitis, unspecified trigger -      fexofenadine-pseudoephedrine (ALLEGRA-D 24) 180-240 MG 24 hr tablet; Take 1 tablet by mouth daily.    Follow Up Instructions:    I discussed the assessment and treatment plan with the patient. The patient was provided an opportunity to ask questions and all were answered. The patient agreed with the plan and demonstrated an understanding of the instructions.   The patient was advised to call back or seek an in-person evaluation if the symptoms worsen or if the condition fails to improve as anticipated.  I provided 14 minutes of non-face-to-face time during this encounter.   26, NP

## 2020-07-04 ENCOUNTER — Ambulatory Visit: Payer: BC Managed Care – PPO | Attending: Internal Medicine

## 2020-07-04 DIAGNOSIS — Z23 Encounter for immunization: Secondary | ICD-10-CM

## 2020-07-04 NOTE — Progress Notes (Signed)
   Covid-19 Vaccination Clinic  Name:  Linda Carrillo    MRN: [id number] DOB: [birth year]  07/04/2020  [name] was observed post Covid-19 immunization for 15 minutes without incident. She was provided with Vaccine Information Sheet and instruction to access the V-Safe system.   [name] was instructed to call 911 with any severe reactions post vaccine: Marland Kitchen Difficulty breathing  . Swelling of face and throat  . A fast heartbeat  . A bad rash all over body  . Dizziness and weakness   Immunizations Administered    Name Date Dose VIS Date Route   Pfizer COVID-19 Vaccine 07/04/2020  5:06 PM 0.3 mL 05/15/2020 Intramuscular   Manufacturer: [employer], [employer]   Lot: [id number]   NDC: [address]-2

## 2020-08-08 DIAGNOSIS — Z20822 Contact with and (suspected) exposure to covid-19: Secondary | ICD-10-CM | POA: Diagnosis not present

## 2020-08-08 DIAGNOSIS — Z03818 Encounter for observation for suspected exposure to other biological agents ruled out: Secondary | ICD-10-CM | POA: Diagnosis not present

## 2020-08-20 ENCOUNTER — Other Ambulatory Visit (INDEPENDENT_AMBULATORY_CARE_PROVIDER_SITE_OTHER): Payer: Self-pay | Admitting: Primary Care

## 2020-08-20 DIAGNOSIS — J4520 Mild intermittent asthma, uncomplicated: Secondary | ICD-10-CM

## 2020-08-20 MED ORDER — BUDESONIDE-FORMOTEROL FUMARATE 160-4.5 MCG/ACT IN AERO: 2.0000 | INHALATION_SPRAY | Freq: Two times a day (BID) | RESPIRATORY_TRACT | 3 refills | Status: AC

## 2020-08-20 MED ORDER — MONTELUKAST SODIUM 10 MG PO TABS
10.0000 mg | ORAL_TABLET | Freq: Every day | ORAL | 3 refills | Status: AC
Start: 2020-08-20 — End: ?

## 2020-10-11 DIAGNOSIS — Z3202 Encounter for pregnancy test, result negative: Secondary | ICD-10-CM | POA: Diagnosis not present

## 2020-10-11 DIAGNOSIS — Z30017 Encounter for initial prescription of implantable subdermal contraceptive: Secondary | ICD-10-CM | POA: Diagnosis not present

## 2020-12-30 DIAGNOSIS — Z113 Encounter for screening for infections with a predominantly sexual mode of transmission: Secondary | ICD-10-CM | POA: Diagnosis not present

## 2021-02-05 DIAGNOSIS — J45909 Unspecified asthma, uncomplicated: Secondary | ICD-10-CM | POA: Diagnosis not present

## 2021-02-05 DIAGNOSIS — Z20822 Contact with and (suspected) exposure to covid-19: Secondary | ICD-10-CM | POA: Diagnosis not present

## 2021-04-10 DIAGNOSIS — J309 Allergic rhinitis, unspecified: Secondary | ICD-10-CM | POA: Diagnosis not present

## 2021-04-10 DIAGNOSIS — J45909 Unspecified asthma, uncomplicated: Secondary | ICD-10-CM | POA: Diagnosis not present

## 2021-05-03 DIAGNOSIS — N911 Secondary amenorrhea: Secondary | ICD-10-CM | POA: Diagnosis not present

## 2021-05-03 DIAGNOSIS — F411 Generalized anxiety disorder: Secondary | ICD-10-CM | POA: Diagnosis not present

## 2021-05-28 DIAGNOSIS — F429 Obsessive-compulsive disorder, unspecified: Secondary | ICD-10-CM | POA: Diagnosis not present

## 2021-05-28 DIAGNOSIS — J45909 Unspecified asthma, uncomplicated: Secondary | ICD-10-CM | POA: Diagnosis not present

## 2021-05-28 DIAGNOSIS — F419 Anxiety disorder, unspecified: Secondary | ICD-10-CM | POA: Diagnosis not present

## 2021-12-26 IMAGING — DX DG CHEST 2V
2 series · 2 of 2 positions shown · non-contrast
Comparison: None.

CLINICAL DATA: Cough, asthma

EXAM:
CHEST - 2 VIEW

[chest pa]
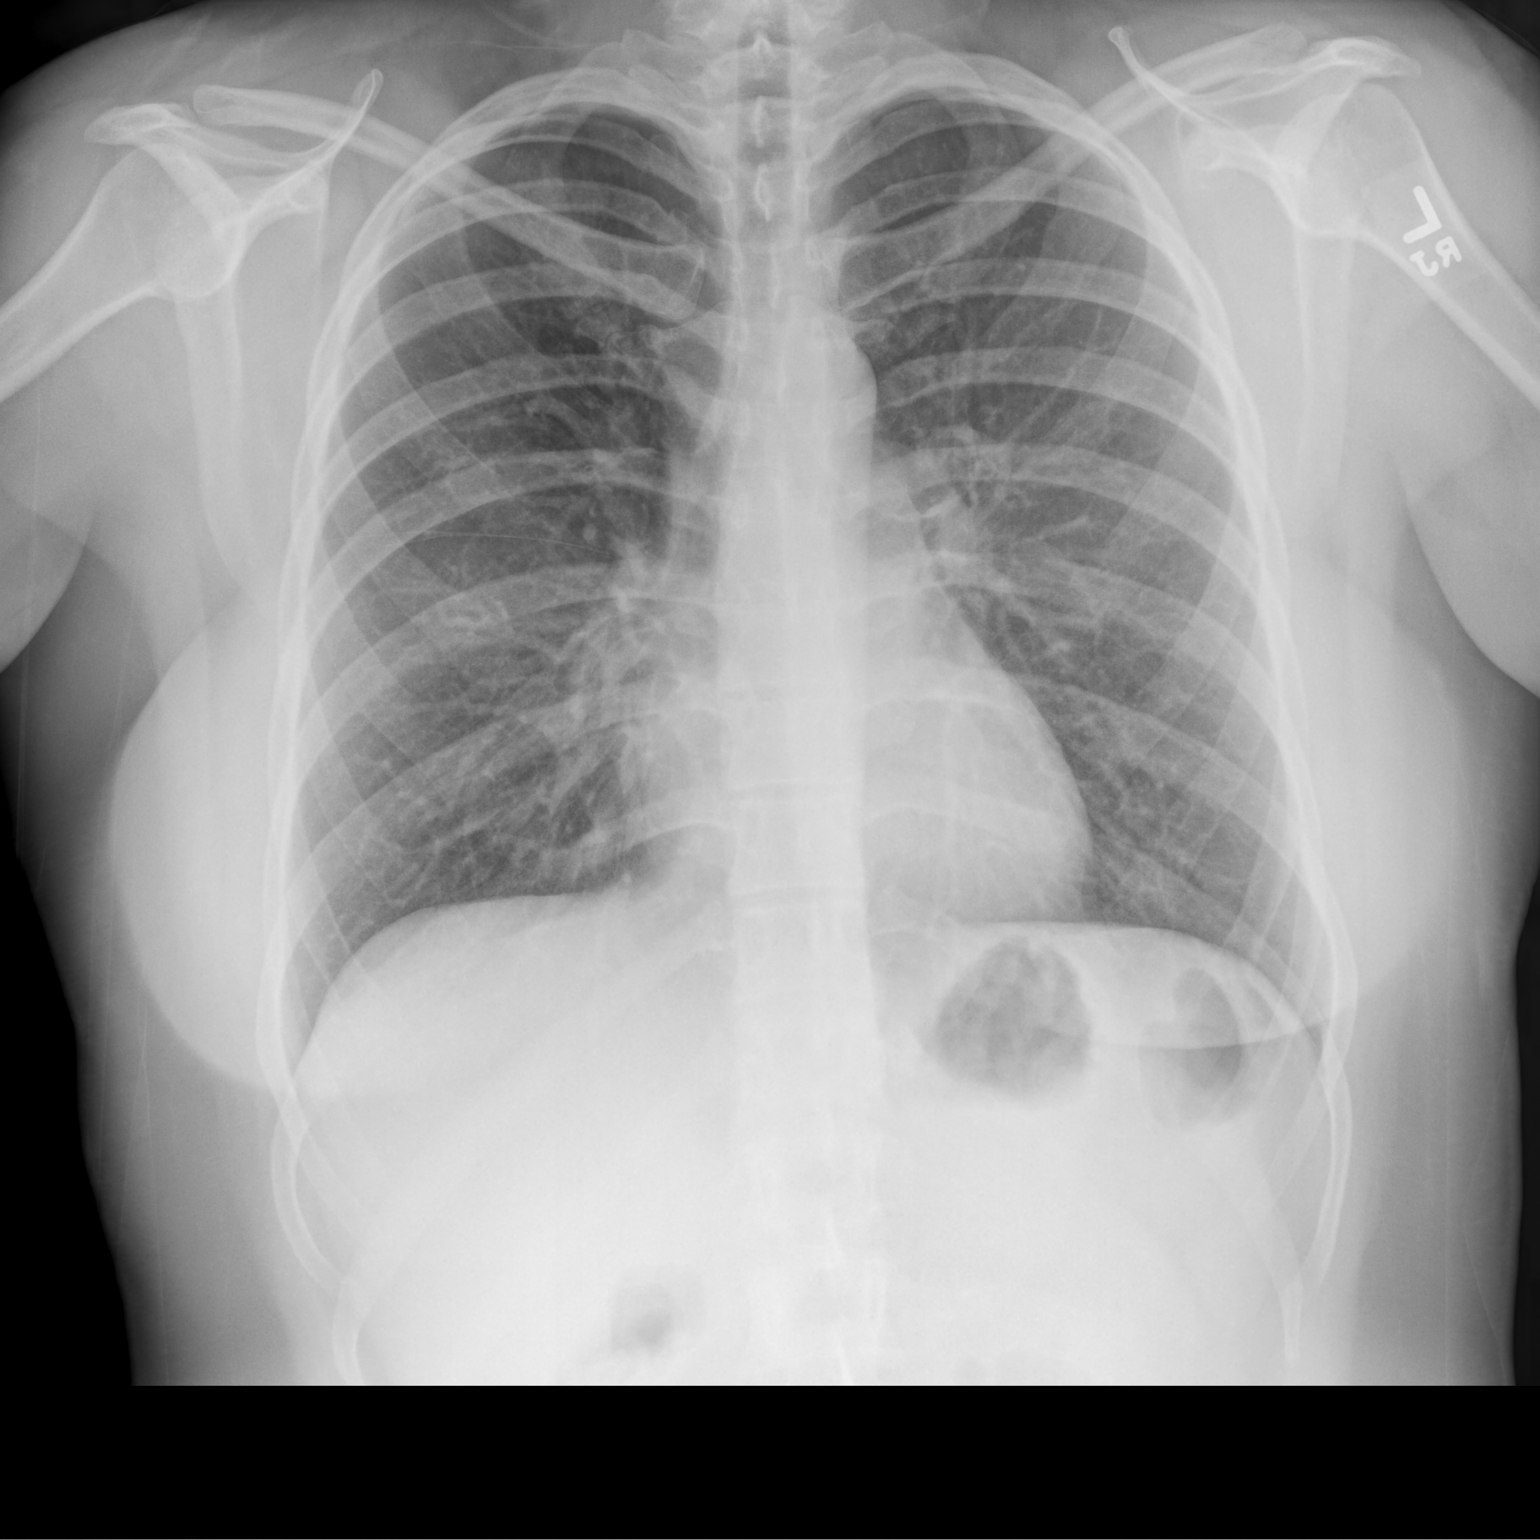

[chest lat]
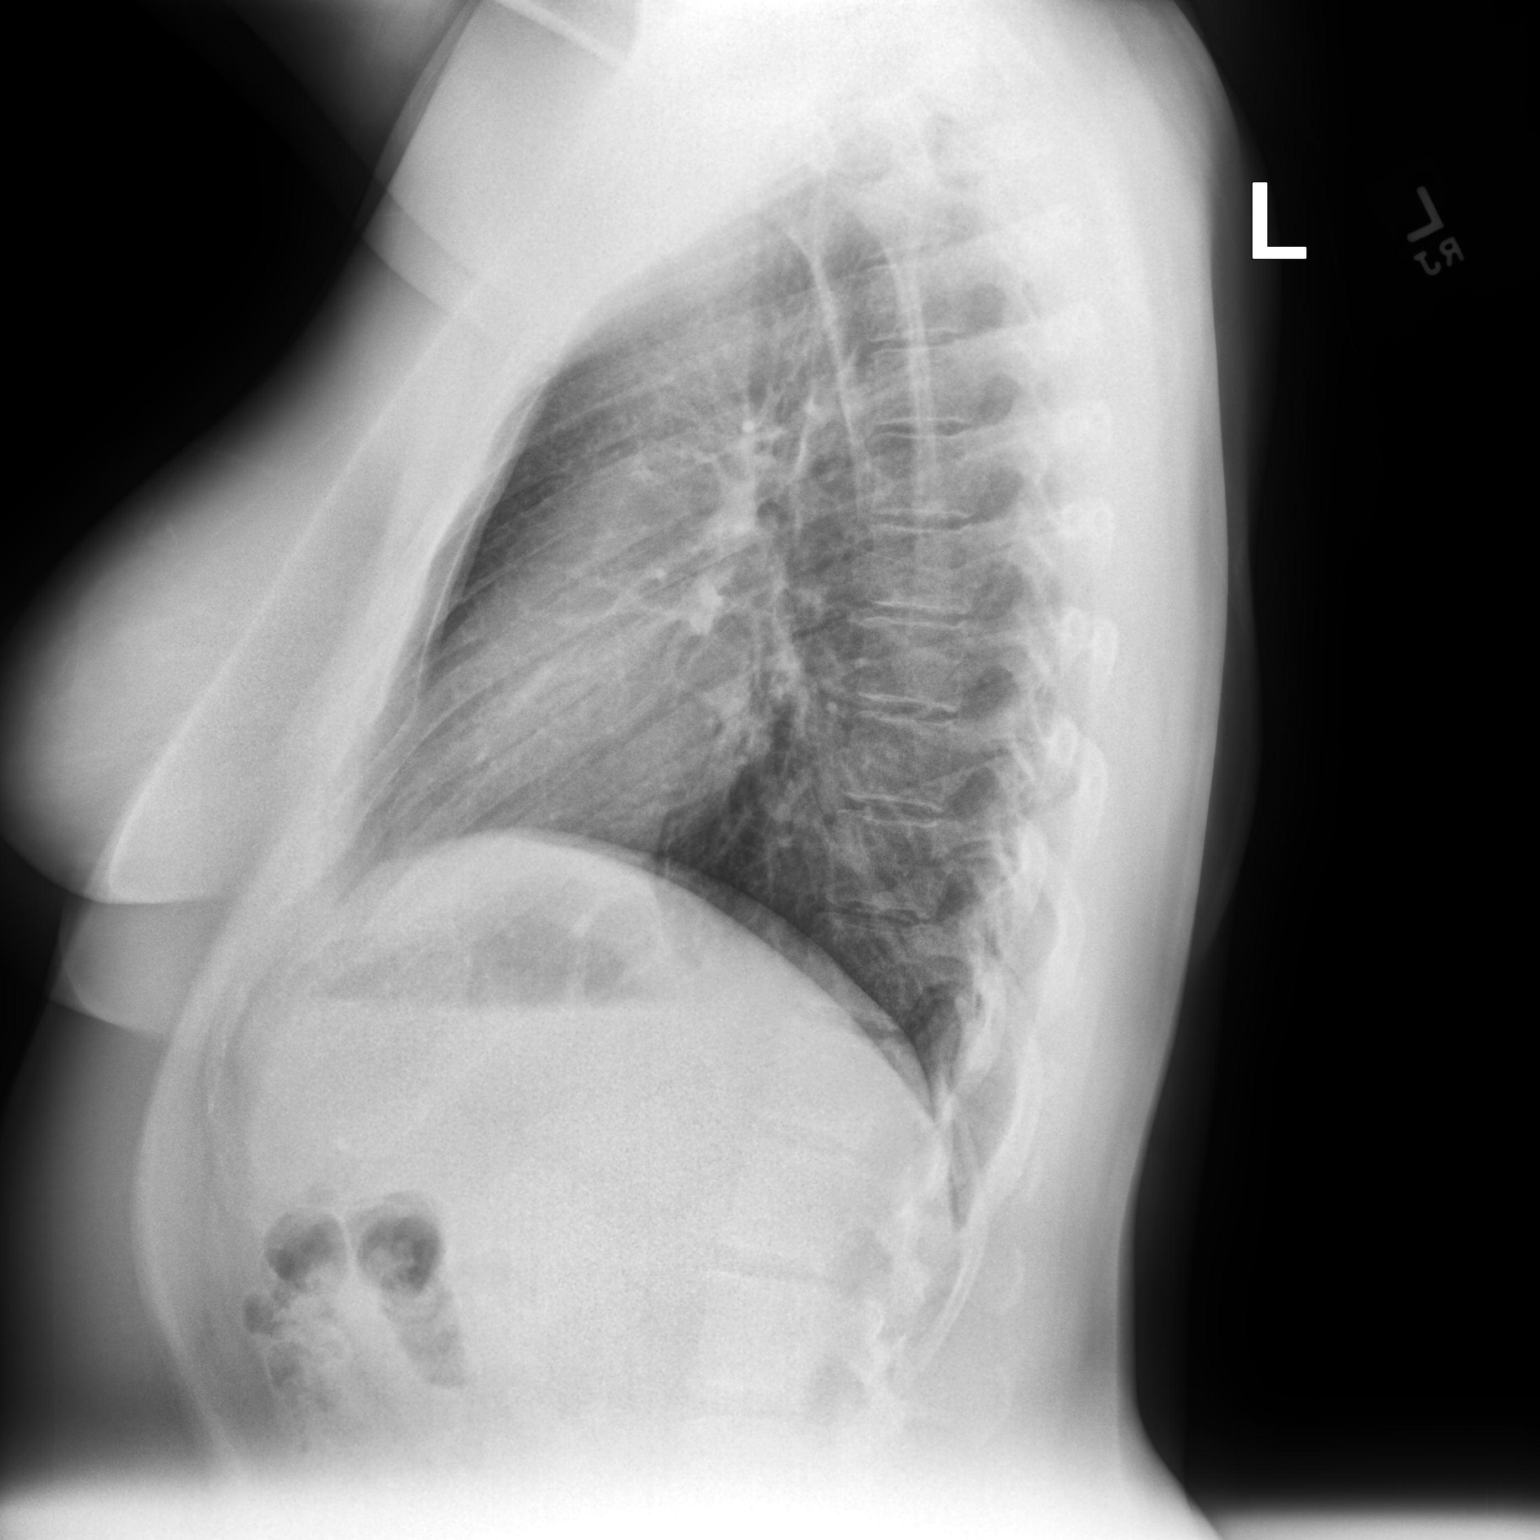

[2 of 2 positions shown; findings below may reference images not displayed]

FINDINGS: The heart size and mediastinal contours are within normal limits.
Both lungs are clear. The visualized skeletal structures are
unremarkable.
IMPRESSION: Normal study.

## 2023-02-22 ENCOUNTER — Other Ambulatory Visit: Payer: Self-pay | Admitting: Nurse Practitioner

## 2023-02-22 DIAGNOSIS — N63 Unspecified lump in unspecified breast: Secondary | ICD-10-CM

## 2023-03-03 ENCOUNTER — Ambulatory Visit: Admission: RE | Admit: 2023-03-03 | Payer: BC Managed Care – PPO | Source: Ambulatory Visit

## 2023-03-03 ENCOUNTER — Ambulatory Visit
Admission: RE | Admit: 2023-03-03 | Discharge: 2023-03-03 | Disposition: A | Discharge status: Home / Self Care | Payer: BC Managed Care – PPO | Source: Ambulatory Visit | Attending: Nurse Practitioner | Admitting: Nurse Practitioner

## 2023-03-03 DIAGNOSIS — N63 Unspecified lump in unspecified breast: Secondary | ICD-10-CM

## 2023-03-04 ENCOUNTER — Encounter (INDEPENDENT_AMBULATORY_CARE_PROVIDER_SITE_OTHER): Payer: Self-pay
